# Patient Record
Sex: Female | Born: 1975
Health system: Southern US, Community
[De-identification: ages and names within clinical notes are randomized; demographics above are authoritative.]

## PROBLEM LIST (undated history)

## (undated) DIAGNOSIS — I10 Essential (primary) hypertension: Secondary | ICD-10-CM

## (undated) DIAGNOSIS — S060X9A Concussion with loss of consciousness of unspecified duration, initial encounter: Secondary | ICD-10-CM

## (undated) DIAGNOSIS — R569 Unspecified convulsions: Secondary | ICD-10-CM

## (undated) DIAGNOSIS — M940 Chondrocostal junction syndrome [Tietze]: Secondary | ICD-10-CM

## (undated) DIAGNOSIS — S060XAA Concussion with loss of consciousness status unknown, initial encounter: Secondary | ICD-10-CM

## (undated) DIAGNOSIS — F319 Bipolar disorder, unspecified: Secondary | ICD-10-CM

## (undated) HISTORY — DX: Bipolar disorder, unspecified: F31.9

## (undated) HISTORY — DX: Essential (primary) hypertension: I10

## (undated) HISTORY — DX: Chondrocostal junction syndrome (tietze): M94.0

---

## 2013-05-08 HISTORY — PX: KNEE SURGERY: SHX244

## 2015-11-05 ENCOUNTER — Encounter (HOSPITAL_COMMUNITY): Payer: Self-pay | Admitting: *Deleted

## 2015-11-05 ENCOUNTER — Emergency Department (HOSPITAL_COMMUNITY)
Admission: EM | Admit: 2015-11-05 | Discharge: 2015-11-05 | Disposition: A | Discharge status: Home / Self Care | Payer: Self-pay | Attending: Emergency Medicine | Admitting: Emergency Medicine

## 2015-11-05 DIAGNOSIS — Z91040 Latex allergy status: Secondary | ICD-10-CM | POA: Insufficient documentation

## 2015-11-05 DIAGNOSIS — F17210 Nicotine dependence, cigarettes, uncomplicated: Secondary | ICD-10-CM | POA: Insufficient documentation

## 2015-11-05 DIAGNOSIS — G40909 Epilepsy, unspecified, not intractable, without status epilepticus: Secondary | ICD-10-CM | POA: Insufficient documentation

## 2015-11-05 DIAGNOSIS — R569 Unspecified convulsions: Secondary | ICD-10-CM

## 2015-11-05 HISTORY — DX: Unspecified convulsions: R56.9

## 2015-11-05 LAB — CBG MONITORING, ED: GLUCOSE-CAPILLARY: 93 mg/dL (ref 65–99)

## 2015-11-05 LAB — CBC WITH DIFFERENTIAL/PLATELET
Basophils Absolute: 0 K/uL (ref 0.0–0.1)
Basophils Relative: 0 %
Eosinophils Absolute: 0.1 K/uL (ref 0.0–0.7)
Eosinophils Relative: 1 %
HCT: 30.3 % — ABNORMAL LOW (ref 36.0–46.0)
Hemoglobin: 10 g/dL — ABNORMAL LOW (ref 12.0–15.0)
Lymphocytes Relative: 26 %
Lymphs Abs: 2.1 K/uL (ref 0.7–4.0)
MCH: 26.5 pg (ref 26.0–34.0)
MCHC: 33 g/dL (ref 30.0–36.0)
MCV: 80.4 fL (ref 78.0–100.0)
Monocytes Absolute: 0.5 K/uL (ref 0.1–1.0)
Monocytes Relative: 6 %
Neutro Abs: 5.3 K/uL (ref 1.7–7.7)
Neutrophils Relative %: 67 %
Platelets: 153 K/uL (ref 150–400)
RBC: 3.77 MIL/uL — ABNORMAL LOW (ref 3.87–5.11)
RDW: 14.6 % (ref 11.5–15.5)
WBC: 7.9 K/uL (ref 4.0–10.5)

## 2015-11-05 LAB — BASIC METABOLIC PANEL WITH GFR
Anion gap: 7 (ref 5–15)
BUN: 6 mg/dL (ref 6–20)
CO2: 18 mmol/L — ABNORMAL LOW (ref 22–32)
Calcium: 8 mg/dL — ABNORMAL LOW (ref 8.9–10.3)
Chloride: 113 mmol/L — ABNORMAL HIGH (ref 101–111)
Creatinine, Ser: 0.66 mg/dL (ref 0.44–1.00)
GFR calc Af Amer: >60 mL/min
GFR calc non Af Amer: >60 mL/min
Glucose, Bld: 82 mg/dL (ref 65–99)
Potassium: 3.9 mmol/L (ref 3.5–5.1)
Sodium: 138 mmol/L (ref 135–145)

## 2015-11-05 MED ORDER — LEVETIRACETAM 1000 MG PO TABS: 1000.0000 mg | ORAL_TABLET | Freq: Two times a day (BID) | ORAL | 0 refills | Status: DC

## 2015-11-05 MED ORDER — SODIUM CHLORIDE 0.9 % IV SOLN
1000.0000 mg | Freq: Once | INTRAVENOUS | Status: AC
  Administered 2015-11-05: 1000 mg via INTRAVENOUS
  Filled 2015-11-05: qty 10

## 2015-11-05 NOTE — ED Notes (Signed)
Patient Alert and oriented X4. Stable and ambulatory. Patient verbalized understanding of the discharge instructions.  Patient belongings were taken by the patient.  

## 2015-11-05 NOTE — ED Notes (Signed)
CBG 93 

## 2015-11-05 NOTE — Discharge Instructions (Signed)
Take keppra as prescribed. [facility] have [facility]. You will need follow up with neurology for seizures as well as cardiology for a new Left bundle branch block seen on EKG. Return to the ED if you experience severe worsening of your symptoms, difficulty breathing, recurrent seizure, blurred vision.

## 2015-11-05 NOTE — ED Triage Notes (Signed)
Pt arrives from home via [facility]. Pt had 2 witnessed tonic clonic seizures today rt not being on her Keppra x1 week. Pt has a LBBB with no known cardiac hx. Pt has c/o chest wall pain that she states is baseline.

## 2015-11-05 NOTE — ED Provider Notes (Signed)
[facility]-EMERGENCY DEPT Provider Note   CSN: [id number] Arrival date & time: 11/05/15  1554     History   Chief Complaint Chief Complaint  Patient presents with  . Seizures    HPI Denise Bennett is a 40 y.o. female with a past medical history of seizure disorder who presents to the ED today to be evaluated after a witnessed seizure. Per patient's son who witnessed the seizure, patient was sitting up in bed watching TV when she began having a generalized tonic-clonic seizure. Patient son states that this lasted for approximately 2 minutes. No head injury or urinary incontinence. Patient remained postictal for approximately 10-15 minutes after the seizure. Patient states she's been having seizures since 2009 and was followed for this by neurology in [place]. She states she ran out of her Keppra about one week ago and has not had enough money to get a new prescription. Patient recently moved to the area and has not seen a neurologist due to lack of money. She denies any blurry vision, weakness, vomiting. She has a mild headache but states this is typically how she feels after having a seizure. 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 Patient also complaining of anterior right-sided chest pain that has been present for 3 years. Pain is worsened with palpation and is intermittent in nature. Pain does not radiate. She denies any diaphoresis, shortness of breath, weakness.  HPI  Past Medical History:  Diagnosis Date  . Seizures (HCC)     There are no active problems to display for this patient.   Past Surgical History:  Procedure Laterality Date  . CESAREAN SECTION    . KNEE SURGERY      OB History    No data available       Home Medications    Prior to Admission medications   Not on File    Family History No family history on file.  Social  History Social History  Substance Use Topics  . Smoking status: Unknown If Ever Smoked  . Smokeless tobacco: Not on file  . Alcohol use No     Allergies   Cinnamon; Penicillins; Latex; Other; and Tape   Review of Systems Review of Systems  All other systems reviewed and are negative.    Physical Exam Updated Vital Signs BP 122/69 (BP Location: Left Arm)   Pulse 60   Temp 98.2 F (36.8 C) (Oral)   Resp 12   SpO2 97%   Physical Exam  Constitutional: She is oriented to person, place, and time. She appears well-developed and well-nourished. No distress.  HENT:  Head: Normocephalic and atraumatic.  Mouth/Throat: No oropharyngeal exudate.  Eyes: Conjunctivae and EOM are normal. Pupils are equal, round, and reactive to light. Right eye exhibits no discharge. Left eye exhibits no discharge. No scleral icterus.  Cardiovascular: Normal rate, regular rhythm, normal heart sounds and intact distal pulses.  Exam reveals no gallop and no friction rub.   No murmur heard. Pulmonary/Chest: Effort normal and breath sounds normal. No respiratory distress. She has no wheezes. She has no rales. She exhibits tenderness ( anterior chest wall TTP).  Abdominal: Soft. She exhibits no distension. There is no tenderness. There is no guarding.  Musculoskeletal: Normal range of motion. She exhibits no edema.  Neurological: She is alert and oriented to person, place, and time. No cranial nerve deficit.  Strength  5/5 throughout. No sensory deficits. No gait abnormality. No dysmetria. No slurred speech. No facial droop. Negative pronator drift.    Skin: Skin is warm and dry. No rash noted. She is not diaphoretic. No erythema. No pallor.  Psychiatric: She has a normal mood and affect. Her behavior is normal.  Nursing note and vitals reviewed.    ED Treatments / Results  Labs (all labs ordered are listed, but only abnormal results are displayed) Labs Reviewed  CBC WITH DIFFERENTIAL/PLATELET -  Abnormal; Notable for the following:       Result Value   RBC 3.77 (*)    Hemoglobin 10.0 (*)    HCT 30.3 (*)    All other components within normal limits  BASIC METABOLIC PANEL - Abnormal; Notable for the following:    Chloride 113 (*)    CO2 18 (*)    Calcium 8.0 (*)    All other components within normal limits  CBG MONITORING, ED    EKG  EKG Interpretation  Date/Time:  Tuesday November 05 2015 18:02:32 EDT Ventricular Rate:  76 PR Interval:    QRS Duration: 153 QT Interval:  465 QTC Calculation: 523 R Axis:   6 Text Interpretation:  Sinus rhythm Left bundle branch block No previous ECGs available Confirmed by ZACKOWSKI  MD, SCOTT ([id number]) on 11/05/2015 6:13:56 PM       Radiology No results found.  Procedures Procedures (including critical care time)  Medications Ordered in ED Medications  levETIRAcetam (KEPPRA) 1,000 mg in sodium chloride 0.9 % 100 mL IVPB (not administered)     Initial Impression / Assessment and Plan / ED Course  I have reviewed the triage vital signs and the nursing notes.  Pertinent labs & imaging results that were available during my care of the patient were reviewed by me and considered in my medical decision making (see chart for details).  Clinical Course    Patient with no evidence of focal neuro deficits on physical exam and is at mental baseline.  Labs and imaging have been reviewed.  Pt was given Keppra load in the ED. Patient is advised to followup with neurologist in regards to today's event. Case management consulted who spoke with pt and arranged for [facility]. Pt will also be able to purchase Keppra prescription for $3.  Spoke with patient and family in detail about driving restrictions until cleared by a neurologist.  Patient verbalizes understanding.  Answered all questions.  Patient is hemodynamically stable and in no acute distress prior to discharge.    Final  Clinical Impressions(s) / ED Diagnoses   Final diagnoses:  Seizure [facility](HCC)    New Prescriptions New Prescriptions   No medications on file     Dub MikesSamantha Tripp Dowless, PA-C 11/06/15 0001    Vanetta MuldersScott Zackowski, MD 11/08/15 [phone]

## 2015-11-05 NOTE — Progress Notes (Signed)
ED CM received consult to assist patient with f/u primary care. CM reviewed record noted no PCP or health coverage listed in record. Patient presented to [facility] ED with c/o seizures and has run of [place].  Met with patient at bedside, confirmed information. Patient reports recently relocating to the area from [place]. She states, she cannot afford her medications, Discussed MATCH program and guidelines patient.  Patient is eligible CM enrolled patient in program, letter printed and given to patient along with list of participating pharmacies and instruction. Patient verbalized understanding teach back done.   ED CM also discussed the importance and benefits of primary care, and reviewed the different types of setting to access health care, and the conditions appropriate for each setting. Offered to assist with obtaining an  appointment patient agreeable Appt scheduled for Wed 10/11 at 11:30 am at the [facility].  Updated S. Dowless PA-C. No further ED CM needs identified.

## 2015-11-06 ENCOUNTER — Encounter: Payer: Self-pay | Admitting: Family Medicine

## 2015-11-06 ENCOUNTER — Ambulatory Visit: Payer: Self-pay | Attending: Family Medicine | Admitting: Family Medicine

## 2015-11-06 DIAGNOSIS — I447 Left bundle-branch block, unspecified: Secondary | ICD-10-CM | POA: Insufficient documentation

## 2015-11-06 DIAGNOSIS — I10 Essential (primary) hypertension: Secondary | ICD-10-CM | POA: Insufficient documentation

## 2015-11-06 DIAGNOSIS — Z91040 Latex allergy status: Secondary | ICD-10-CM | POA: Insufficient documentation

## 2015-11-06 DIAGNOSIS — Z880 Allergy status to penicillin: Secondary | ICD-10-CM | POA: Insufficient documentation

## 2015-11-06 DIAGNOSIS — E162 Hypoglycemia, unspecified: Secondary | ICD-10-CM | POA: Insufficient documentation

## 2015-11-06 DIAGNOSIS — R569 Unspecified convulsions: Secondary | ICD-10-CM | POA: Insufficient documentation

## 2015-11-06 DIAGNOSIS — Z9109 Other allergy status, other than to drugs and biological substances: Secondary | ICD-10-CM | POA: Insufficient documentation

## 2015-11-06 DIAGNOSIS — Z91018 Allergy to other foods: Secondary | ICD-10-CM | POA: Insufficient documentation

## 2015-11-06 MED ORDER — ALBUTEROL SULFATE HFA 108 (90 BASE) MCG/ACT IN AERS: 1.0000 | INHALATION_SPRAY | Freq: Four times a day (QID) | RESPIRATORY_TRACT | 1 refills | Status: DC | PRN

## 2015-11-06 NOTE — Progress Notes (Signed)
Subjective:  Patient ID: Denise Bennett, female    DOB: [birth year]  Age: 40 y.o. MRN: [id number]  CC: Seizures (was in ED yesterday after 2 seizures); Hypoglycemia; Hypertension; Manic Behavior; and chest wall inflammation   HPI Denise Bennett is a 40 year old female with a history of seizures who relocated from [place] recently and was [facility] yesterday with a partial seizure (witnessed by her son) after running out of her Keppra. While in [place] she was previously followed by neurologist but has none here.  She was given a loading dose of Keppra and discharged on Keppra which she is currently taking and is requesting a referral to neurology. EKG done at the ED revealed left bundle branch block; no previous EKG to compare with. Patient has no prior cardiac history and denies shortness of breath or palpitations at this time. She has some right-sided chest pain which occurred when she had the seizure yesterday.  Past Medical History:  Diagnosis Date  . Seizures (HCC)     Past Surgical History:  Procedure Laterality Date  . CESAREAN SECTION    . KNEE SURGERY      Allergies  Allergen Reactions  . Cinnamon Anaphylaxis  . Penicillins Anaphylaxis and Other (See Comments)    Has patient had a PCN reaction causing immediate rash, facial/tongue/throat swelling, SOB or lightheadedness with hypotension: Yes Has patient had a PCN reaction causing severe rash involving mucus membranes or skin necrosis: Unknown Has patient had a PCN reaction that required hospitalization: Yes Has patient had a PCN reaction occurring within the last 10 years: No If all of the above answers are "NO", then may proceed with Cephalosporin use. "Ended up in an O2 tent"   . Latex Rash    RASH TURNS INTO WELTS  . Other Itching and Rash    GREEN (BELL) PEPPERS   . Tape Rash    RASH TURNS INTO WELTS      Outpatient Medications Prior to Visit  Medication Sig Dispense Refill  . acetaminophen  (TYLENOL) 500 MG tablet Take 500-1,000 mg by mouth every 6 (six) hours as needed for headache.    . diphenhydrAMINE (BENADRYL) 25 mg capsule Take 25 mg by mouth every 6 (six) hours as needed for itching or allergies.    Marland Kitchen. levETIRAcetam (KEPPRA) 1000 MG tablet Take 1 tablet (1,000 mg total) by mouth 2 (two) times daily. 60 tablet 0  . albuterol (PROAIR HFA) 108 (90 Base) MCG/ACT inhaler Inhale 1-2 puffs into the lungs every 6 (six) hours as needed for wheezing or shortness of breath.    . levETIRAcetam (KEPPRA) 500 MG tablet Take 1,000 mg by mouth 2 (two) times daily.     No facility-administered medications prior to visit.     ROS Review of Systems  Constitutional: Negative for activity change, appetite change and fatigue.  HENT: Negative for congestion, sinus pressure and sore throat.   Eyes: Negative for visual disturbance.  Respiratory: Negative for cough, chest tightness, shortness of breath and wheezing.   Cardiovascular: Negative for chest pain and palpitations.  Gastrointestinal: Negative for abdominal distention, abdominal pain and constipation.  Endocrine: Negative for polydipsia.  Genitourinary: Negative for dysuria and frequency.  Musculoskeletal: Negative for arthralgias and back pain.  Skin: Negative for rash.  Neurological: Negative for tremors, light-headedness and numbness.  Hematological: Does not bruise/bleed easily.  Psychiatric/Behavioral: Negative for agitation and behavioral problems.    Objective:  BP (P) 119/75 (BP Location: Right Arm, Patient Position: Sitting, Cuff Size:  Large)   Pulse (P) 67   Temp (P) 98.1 F (36.7 C) (Oral)   Ht (P) 5' 4.5" (1.638 m)   Wt (P) 207 lb 6.4 oz (94.1 kg)   SpO2 (P) 99%   BMI (P) 35.05 kg/m   BP/Weight 11/05/2015  Systolic BP 126  Diastolic BP 79      Physical Exam  Constitutional: She is oriented to person, place, and time. She appears well-developed and well-nourished.  Cardiovascular: Normal rate, normal heart  sounds and intact distal pulses.   No murmur heard. Pulmonary/Chest: Effort normal and breath sounds normal. She has no wheezes. She has no rales. She exhibits tenderness (right-sided tenderness on deep palpation).  Abdominal: Soft. Bowel sounds are normal. She exhibits no distension and no mass. There is no tenderness.  Musculoskeletal: Normal range of motion.  Neurological: She is alert and oriented to person, place, and time.     Assessment & Plan:   1. Seizures (HCC) Continue Keppra Avoid driving Will place referral to neurology once she has been approved for [facility] this; I have advised her to obtain paperwork for this application. Follow-up for coordination of care at next visit  2. Left bundle branch block (LBBB) on electrocardiogram Asymptomatic with no prior cardiac history - Ambulatory referral to Cardiology   Meds ordered this encounter  Medications  . albuterol (PROAIR HFA) 108 (90 Base) MCG/ACT inhaler    Sig: Inhale 1-2 puffs into the lungs every 6 (six) hours as needed for wheezing or shortness of breath.    Dispense:  1 Inhaler    Refill:  1    Follow-up: Return in about 1 month (around 12/07/2015) for follow up on seizures.   Jaclyn Shaggy MD

## 2015-11-06 NOTE — Progress Notes (Signed)
Here for medication refills and referrals

## 2015-11-29 ENCOUNTER — Ambulatory Visit: Payer: Self-pay

## 2015-12-04 ENCOUNTER — Ambulatory Visit: Payer: Self-pay | Attending: Family Medicine | Admitting: Family Medicine

## 2015-12-04 ENCOUNTER — Encounter: Payer: Self-pay | Admitting: Family Medicine

## 2015-12-04 VITALS — BP 146/85 | HR 63 | Temp 97.8°F | Resp 16 | Ht 64.0 in | Wt 200.0 lb

## 2015-12-04 DIAGNOSIS — R569 Unspecified convulsions: Secondary | ICD-10-CM

## 2015-12-04 DIAGNOSIS — Z880 Allergy status to penicillin: Secondary | ICD-10-CM | POA: Insufficient documentation

## 2015-12-04 DIAGNOSIS — Z9109 Other allergy status, other than to drugs and biological substances: Secondary | ICD-10-CM | POA: Insufficient documentation

## 2015-12-04 DIAGNOSIS — Z91040 Latex allergy status: Secondary | ICD-10-CM | POA: Insufficient documentation

## 2015-12-04 DIAGNOSIS — M940 Chondrocostal junction syndrome [Tietze]: Secondary | ICD-10-CM

## 2015-12-04 DIAGNOSIS — Z91018 Allergy to other foods: Secondary | ICD-10-CM | POA: Insufficient documentation

## 2015-12-04 DIAGNOSIS — I447 Left bundle-branch block, unspecified: Secondary | ICD-10-CM

## 2015-12-04 MED ORDER — LEVETIRACETAM 1000 MG PO TABS: 1000.0000 mg | ORAL_TABLET | Freq: Two times a day (BID) | ORAL | 1 refills | Status: DC

## 2015-12-04 MED ORDER — IBUPROFEN 600 MG PO TABS: 600.0000 mg | ORAL_TABLET | Freq: Two times a day (BID) | ORAL | 1 refills | Status: DC | PRN

## 2015-12-04 MED FILL — levETIRAcetam 1000 MG TABS: 1000 | 30 days supply | Qty: 60 | Fill #0

## 2015-12-04 MED FILL — IBUPROFEN 600 MG TABLET: 600 | 30 days supply | Qty: 60 | Fill #0

## 2015-12-04 NOTE — Progress Notes (Signed)
Subjective:  Patient ID: Denise Bennett, female    DOB: [birth year]  Age: 40 y.o. MRN: [id number]  CC: Follow-up (seizures); Chest Pain (mild- comes and goes x's 3 days); and Nausea   HPI Denise Bennett is a 40 year old female with a history of seizures who presents to the clinic for a follow-up visit. She remains on Keppra and has not had any seizures in the last 1 month. Neurology referral is pending approval of [facility] financial application.  She was also referred to cardiology due to presence of left bundle branch block on EKG however attempts of cardiology office to reach patient proved futile.  She has no complaints at this time.  Past Medical History:  Diagnosis Date  . Seizures (HCC)     Past Surgical History:  Procedure Laterality Date  . CESAREAN SECTION    . KNEE SURGERY      Allergies  Allergen Reactions  . Cinnamon Anaphylaxis  . Penicillins Anaphylaxis and Other (See Comments)    Has patient had a PCN reaction causing immediate rash, facial/tongue/throat swelling, SOB or lightheadedness with hypotension: Yes Has patient had a PCN reaction causing severe rash involving mucus membranes or skin necrosis: Unknown Has patient had a PCN reaction that required hospitalization: Yes Has patient had a PCN reaction occurring within the last 10 years: No If all of the above answers are "NO", then may proceed with Cephalosporin use. "Ended up in an O2 tent"   . Latex Rash    RASH TURNS INTO WELTS  . Other Itching and Rash    GREEN (BELL) PEPPERS   . Tape Rash    RASH TURNS INTO WELTS     Outpatient Medications Prior to Visit  Medication Sig Dispense Refill  . acetaminophen (TYLENOL) 500 MG tablet Take 500-1,000 mg by mouth every 6 (six) hours as needed for headache.    . albuterol (PROAIR HFA) 108 (90 Base) MCG/ACT inhaler Inhale 1-2 puffs into the lungs every 6 (six) hours as needed for wheezing or shortness of breath. 1 Inhaler 1  . diphenhydrAMINE (BENADRYL) 25  mg capsule Take 25 mg by mouth every 6 (six) hours as needed for itching or allergies.    Marland Kitchen. levETIRAcetam (KEPPRA) 1000 MG tablet Take 1 tablet (1,000 mg total) by mouth 2 (two) times daily. 60 tablet 0  . levETIRAcetam (KEPPRA) 500 MG tablet Take 1,000 mg by mouth 2 (two) times daily.     No facility-administered medications prior to visit.     ROS Review of Systems  Constitutional: Negative for activity change and appetite change.  HENT: Negative for sinus pressure and sore throat.   Respiratory: Negative for chest tightness, shortness of breath and wheezing.   Cardiovascular: Negative for chest pain and palpitations.  Gastrointestinal: Negative for abdominal distention, abdominal pain and constipation.  Genitourinary: Negative.   Musculoskeletal: Negative.   Psychiatric/Behavioral: Negative for behavioral problems and dysphoric mood.    Objective:  BP (!) 146/85 (BP Location: Right Arm, Patient Position: Sitting)   Pulse 63   Temp 97.8 F (36.6 C) (Oral)   Resp 16   Ht 5\' 4"  (1.626 m)   Wt 200 lb (90.7 kg)   SpO2 99%   BMI 34.33 kg/m   BP/Weight 12/04/2015 11/05/2015  Systolic BP 146 126  Diastolic BP 85 79  Wt. (Lbs) 200 -  BMI 34.33 -      Physical Exam  Constitutional: She is oriented to person, place, and time. She appears well-developed and well-nourished.  Cardiovascular: Normal rate, normal heart sounds and intact distal pulses.   No murmur heard. Pulmonary/Chest: Effort normal and breath sounds normal. She has no wheezes. She has no rales. She exhibits tenderness (right chest wall tenderness to deep palpation).  Abdominal: Soft. Bowel sounds are normal. She exhibits no distension and no mass. There is no tenderness.  Musculoskeletal: Normal range of motion.  Neurological: She is alert and oriented to person, place, and time.     Assessment & Plan:   1. Costochondritis - ibuprofen (ADVIL,MOTRIN) 600 MG tablet; Take 1 tablet (600 mg total) by mouth 2  (two) times daily as needed.  Dispense: 60 tablet; Refill: 1  2. Left bundle branch block (LBBB) on electrocardiogram Referred to cardiology who have been unable to reach the patient She has been provided with the number to cardiology on [address] to call and schedule an appointment.  3. Seizures (HCC) No recent seizures Currently working on [facility] application for referral to neurology. - levETIRAcetam (KEPPRA) 1000 MG tablet; Take 1 tablet (1,000 mg total) by mouth 2 (two) times daily.  Dispense: 60 tablet; Refill: 1   Meds ordered this encounter  Medications  . levETIRAcetam (KEPPRA) 1000 MG tablet    Sig: Take 1 tablet (1,000 mg total) by mouth 2 (two) times daily.    Dispense:  60 tablet    Refill:  1  . ibuprofen (ADVIL,MOTRIN) 600 MG tablet    Sig: Take 1 tablet (600 mg total) by mouth 2 (two) times daily as needed.    Dispense:  60 tablet    Refill:  1    Follow-up: Return in about 2 months (around 02/03/2016) for follow up of seizures.   Jaclyn ShaggyEnobong Amao MD

## 2015-12-04 NOTE — Patient Instructions (Signed)

## 2015-12-04 NOTE — Progress Notes (Signed)
Med refills

## 2015-12-09 ENCOUNTER — Ambulatory Visit (INDEPENDENT_AMBULATORY_CARE_PROVIDER_SITE_OTHER): Payer: Self-pay | Admitting: Cardiology

## 2015-12-09 ENCOUNTER — Encounter: Payer: Self-pay | Admitting: Cardiology

## 2015-12-09 VITALS — BP 136/82 | HR 71 | Ht 64.5 in | Wt 200.0 lb

## 2015-12-09 DIAGNOSIS — I447 Left bundle-branch block, unspecified: Secondary | ICD-10-CM

## 2015-12-09 DIAGNOSIS — R0789 Other chest pain: Secondary | ICD-10-CM | POA: Insufficient documentation

## 2015-12-09 DIAGNOSIS — R002 Palpitations: Secondary | ICD-10-CM

## 2015-12-09 DIAGNOSIS — M940 Chondrocostal junction syndrome [Tietze]: Secondary | ICD-10-CM

## 2015-12-09 DIAGNOSIS — R079 Chest pain, unspecified: Secondary | ICD-10-CM

## 2015-12-09 NOTE — Progress Notes (Signed)
PCP: Jaclyn ShaggyEnobong, Amao, MD  Clinic Note: Chief Complaint  Patient presents with  . New Patient (Initial Visit)    Left bundle branch block on EKG  . Chest Pain    HPI: Denise Bennett is a 40 y.o. female with a PMH notable for seizure disorder (deep brain seizures) who presents today for evaluation of intermittent chest pain and nausea. Also reported left bundle branch block.Marland Kitchen.   Errica Guarnieri was seen on 12/04/2015 by her PCP and noted some intermittent chest pain. Currently prior to that she was referred to cardiology for left bundle branch block. She was felt to have likely costochondritis and given Advil  Recent Hospitalizations: ER visit for a witnessed seizure on 11/05/2015 --> EKG done post procedure showed left bundle branch block. Per report, the seizure lasted about 2 minutes. She then had a postictal episode of roughly 10-15 minutes. She apparently complained of right-sided chest pain at that time. This is been present for years and was told that this was costochondritis.   Studies Reviewed:   EKG from 11/05/2015 did show a left bundle branch block  Interval History: Breezie presents today for follow-up evaluation from her ER visit. One concern for ER visit was a left bundle branch block, the other was that she's been having chest pain off and on. Chest discomfort that she is describing is different and separate from her chronic reproducible mostly right sided chest pain from costochondritis. This is a long-standing issue for her. The current chest discomfort episode is actually more of a tightness and pressure that she feels if she is under a lot of stress (which has been quite frequent with her recent move). She also notes that if she walks too fast. With a slow easy walk, she does okay, but other times will continue to have this chest discomfort which lasts several minutes. She may have 3-4 of these episodes in the course of the week depending what she does. She also has had several  episodes where she feels her heart rate increases as if it is "beating out of her chest ". It is steady, but fast and hard. Not irregular. It may last 15-20 minutes until she is able to calm herself back down again. This also occurs about 3-4 times a week.  Today she actually walked almost 2 hours to the bus stop that she then took the bus here to the clinic visit. That two-hour walk was made as long as it was because she had to stop several times for dyspnea and at least once for chest discomfort.  She denies any resting chest pain or dyspnea, distantly exertional chest pain dyspnea as well as costochondritis pain. With her rapid heartbeats, she denies any syncope, but has had some dizziness and near syncope. No amaurosis fugax or TIA symptoms.  No claudication.:  No flowsheet data found.  ROS: A comprehensive was performed. Review of Systems  Constitutional: Negative for malaise/fatigue.  HENT: Negative for congestion and nosebleeds.   Eyes: Positive for blurred vision (Postictal).  Respiratory: Negative for cough, shortness of breath and wheezing.   Cardiovascular: Positive for chest pain and palpitations.       See history of present illness; costochondritis usually treated with ibuprofen to some success  Gastrointestinal: Positive for heartburn. Negative for abdominal pain, blood in stool, constipation, melena, nausea and vomiting.  Genitourinary: Negative for hematuria.  Musculoskeletal: Positive for joint pain. Negative for back pain and falls. Myalgias: Postictal.       Chronic chest  wall pain  Neurological: Positive for headaches (Often after her seizures). Negative for weakness.       She does have some residual weakness of the right upper and lower extremity as well as decreased sensation of her right face as a result of her "deep brain seizures "  Endo/Heme/Allergies: Negative for environmental allergies.  Psychiatric/Behavioral: Negative for depression and memory loss. The  patient is not nervous/anxious.     Complete history including medical and surgical history as well as family and social history reviewed and updated (roughly 15 min) Past Medical History:  Diagnosis Date  . Bipolar 1 disorder (HCC)    Manic - depressive.  . 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 Hypertension   . Seizures (HCC)    Reportedly "deep brain seizures " - has some right-sided weakness (decreased sensation of facial nerve) over the seizures with some intermittent Bell's palsy symptoms.    Past Surgical History:  Procedure Laterality Date  . CESAREAN SECTION  10/31/1992, 03/02/1996.  Marland Kitchen KNEE SURGERY Left 05/08/2013   Prior to Admission medications   Medication Sig Start Date End Date Taking? Authorizing Provider  acetaminophen (TYLENOL) 500 MG tablet Take 500-1,000 mg by mouth every 6 (six) hours as needed for headache.   Yes Historical Provider, MD  albuterol (PROAIR HFA) 108 (90 Base) MCG/ACT inhaler Inhale 1-2 puffs into the lungs every 6 (six) hours as needed for wheezing or shortness of breath. 11/06/15  Yes Jaclyn Shaggy, MD  diphenhydrAMINE (BENADRYL) 25 mg capsule Take 25 mg by mouth every 6 (six) hours as needed for itching or allergies.   Yes Historical Provider, MD  ibuprofen (ADVIL,MOTRIN) 600 MG tablet Take 1 tablet (600 mg total) by mouth 2 (two) times daily as needed. 12/04/15  Yes Jaclyn Shaggy, MD  levETIRAcetam (KEPPRA) 1000 MG tablet Take 1 tablet (1,000 mg total) by mouth 2 (two) times daily. 12/04/15  Yes Jaclyn Shaggy, MD   Allergies  Allergen Reactions  . Cinnamon Anaphylaxis  . Penicillins Anaphylaxis and Other (See Comments)    Has patient had a PCN reaction causing immediate rash, facial/tongue/throat swelling, SOB or lightheadedness with hypotension: Yes Has patient had a PCN reaction causing severe rash involving mucus membranes or skin necrosis: Unknown Has patient had a PCN reaction that required hospitalization: Yes Has  patient had a PCN reaction occurring within the last 10 years: No If all of the above answers are "NO", then may proceed with Cephalosporin use. "Ended up in an O2 tent"   . Latex Rash    RASH TURNS INTO WELTS  . Other Itching and Rash    GREEN (BELL) PEPPERS   . Tape Rash    RASH TURNS INTO WELTS     Social History   Social History  . Marital status: Married    Spouse name: N/A  . Number of children: 2  . Years of education: GED   Occupational History  . Disabled     Secondary to seizures   Social History Main Topics  . Smoking status: Current Every Day Smoker    Packs/day: 0.50    Years: 15.00    Types: Cigarettes  . Smokeless tobacco: Never Used  . Alcohol use No  . Drug use: No  . Sexual activity: Yes    Partners: Male   Other Topics Concern  . None   Social History Narrative   She is married mother of 2 with one stepson. Her first child was from  a different father. She lives with her husband and son. They have recently moved from [place] to [facility].   She has been smoking for about 15 years.   She walks quite a bit several days a week at least 1-1/2-2 hours at a slow pace.   Family History  Problem Relation Age of Onset  . Heart attack Mother   . Heart attack Father   . Healthy Sister   . Psychiatric Illness Brother     Multiple personality disorder  . Healthy Brother   . Healthy Brother   . Healthy Sister   . Healthy Sister   . Healthy Sister   . Diabetes Mellitus I Son   . Bone cancer Paternal Grandmother   . Lung cancer Paternal Grandfather      Wt Readings from Last 3 Encounters:  12/09/15 90.7 kg (200 lb)  12/04/15 90.7 kg (200 lb)  11/06/15 (P) 94.1 kg (207 lb 6.4 oz)    PHYSICAL EXAM BP 136/82 (BP Location: Left Arm, Cuff Size: Normal)   Pulse 71   Ht 5' 4.5" (1.638 m)   Wt 90.7 kg (200 lb)   LMP 12/02/2015   BMI 33.80 kg/m  General appearance: alert, cooperative, appears stated age, no distress and Mildly  obese. She is nourished, but somewhat disheveled - has smell of cigarettes and pet dander. She seems somewhat anxious, but otherwise normal mood and affect. HEENT: [place]/AT, EOMI, MMM, anicteric sclera  Neck: no adenopathy, no carotid bruit and no JVD Lungs: clear to auscultation bilaterally, normal percussion bilaterally and non-labored Heart: regular rate and rhythm, S1& S2 normal, no murmur, click, rub or gallop; non-displaced PMI ;  Chest wall TTP along R>L sternal border. (different than CP in question) Abdomen: soft, non-tender; bowel sounds normal; no masses,  no organomegaly; no HJR Extremities: extremities normal, atraumatic, no cyanosis, or edema  Pulses: 2+ and symmetric;  Skin: mobility and turgor normal, no evidence of bleeding or bruising and no lesions noted or Neurologic: Mental status: Alert, oriented, thought content appropriate Cranial nerves: normal (II-XII grossly intact) with the exception of reduced R facial N sensation    Adult ECG Report  Rate: 71 ;  Rhythm: normal sinus rhythm and Normal axis, intervals and durations.;   Narrative Interpretation: When compared to the last 2 EKGs from emergency room visit, left bundle branch block is no longer present (this would suggest possible rate related left bundle branch block)   Other studies Reviewed: Additional studies/ records that were reviewed today include:  Recent Labs:     Chemistry      Component Value Date/Time   NA 138 11/05/2015 1835   K 3.9 11/05/2015 1835   CL 113 (H) 11/05/2015 1835   CO2 18 (L) 11/05/2015 1835   BUN 6 11/05/2015 1835   CREATININE 0.66 11/05/2015 1835      Component Value Date/Time   CALCIUM 8.0 (L) 11/05/2015 1835       ASSESSMENT / PLAN: Problem List Items Addressed This Visit    Left bundle branch block (LBBB) on electrocardiogram - Primary    Interestingly, the current EKG did not show LBBB. The rate is slightly slower today. Cannot exclude rate related bundle branch block. We  will assess for ischemia and EF with Myoview stress test.      Relevant Orders   EKG 12-Lead (Completed)   Myocardial Perfusion Imaging   Cardiac event monitor   Costochondritis    Relatively well-controlled with NSAIDs. Lid different symptoms  than the more anginal type symptom.      Chest pain with moderate risk for cardiac etiology    She clearly has 2 different types of chest discomfort. One is clearly reproducible right-sided costal neuritis pain that she is well aware of. But she now has a different symptom that seems to be not reproducible, and mostly exertional in nature associated with some dyspnea. It is not with a routine mild exertion, but it is usually when she walks too far or is under lots of stress.  She indicates to me that she is not sure she would be able to walk at fast beats on the treadmill because of her mild residual weakness on the right leg. She has a significant family history for both her parents having died relatively early age with heart attacks. She also has had evidence of intermittent left bundle branch block at a relatively young age which would also be concerning for possible underlying coronary disease.  PLAN: Myoview stress test (this will allow us to potentially convert to Lexiscan from treadmill in order to complete study) -- this will also give us an estimation of her overall cardiac function given her exertional dyspnea.      Relevant Orders   EKG 12-Lead (Completed)   Myocardial Perfusion Imaging   Cardiac event monitor   Palpitations   Relevant Orders   EKG 12-Lead (Completed)   Myocardial Perfusion Imaging   Cardiac event monitor      Current medicines are reviewed at length with the patient today. (+/- concerns) n/a The following changes have been made: none  Patient Instructions  SCHEDULE AT [address] Your physician has requested that you have en exercise stress myoview. For further information please visit  [email or web address]. Please follow instruction sheet, as given.  SCHEDULE AT [address]  [address] Your physician has recommended that you wear an event monitor WEAR FOR 30 DAYS. Event monitors are medical devices that record the heart's electrical activity. Doctors most often us these monitors to diagnose arrhythmias. Arrhythmias are problems with the speed or rhythm of the heartbeat. The monitor is a small, portable device. You can wear one while you do your normal daily activities. This is usually used to diagnose what is causing palpitations/syncope (passing out).    Your physician recommends that you schedule a follow-up appointment in:6-8 WEEKS WITH DR HARDING.   Studies Ordered:   Orders Placed This Encounter  Procedures  . Myocardial Perfusion Imaging  . Cardiac event monitor  . EKG 12-Lead      Bryan Lemmaavid Harding, M.D., M.S. Interventional Cardiologist   Pager # [phone] Phone # [phone] [address] [place], [place] [id number]

## 2015-12-09 NOTE — Patient Instructions (Addendum)
SCHEDULE AT [address] Your physician has requested that you have en exercise stress myoview. For further information please visit [email or web address]. Please follow instruction sheet, as given.  SCHEDULE AT [address]  [address] Your physician has recommended that you wear an event monitor WEAR FOR 30 DAYS. Event monitors are medical devices that record the heart's electrical activity. Doctors most often us these monitors to diagnose arrhythmias. Arrhythmias are problems with the speed or rhythm of the heartbeat. The monitor is a small, portable device. You can wear one while you do your normal daily activities. This is usually used to diagnose what is causing palpitations/syncope (passing out).    Your physician recommends that you schedule a follow-up appointment in:6-8 WEEKS WITH DR HARDING.

## 2015-12-10 ENCOUNTER — Encounter: Payer: Self-pay | Admitting: Cardiology

## 2015-12-10 NOTE — Assessment & Plan Note (Signed)
She clearly has 2 different types of chest discomfort. One is clearly reproducible right-sided costal neuritis pain that she is well aware of. But she now has a different symptom that seems to be not reproducible, and mostly exertional in nature associated with some dyspnea. It is not with a routine mild exertion, but it is usually when she walks too far or is under lots of stress.  She indicates to me that she is not sure she would be able to walk at fast beats on the treadmill because of her mild residual weakness on the right leg. She has a significant family history for both her parents having died relatively early age with heart attacks. She also has had evidence of intermittent left bundle branch block at a relatively young age which would also be concerning for possible underlying coronary disease.  PLAN: Myoview stress test (this will allow us to potentially convert to Lexiscan from treadmill in order to complete study) -- this will also give us an estimation of her overall cardiac function given her exertional dyspnea.

## 2015-12-10 NOTE — Assessment & Plan Note (Signed)
Interestingly, the current EKG did not show LBBB. The rate is slightly slower today. Cannot exclude rate related bundle branch block. We will assess for ischemia and EF with Myoview stress test.

## 2015-12-10 NOTE — Assessment & Plan Note (Signed)
Relatively well-controlled with NSAIDs. Lid different symptoms than the more anginal type symptom.

## 2015-12-27 ENCOUNTER — Ambulatory Visit: Payer: Self-pay

## 2016-04-02 ENCOUNTER — Other Ambulatory Visit: Payer: Self-pay

## 2016-04-02 ENCOUNTER — Emergency Department (HOSPITAL_COMMUNITY): Payer: Self-pay

## 2016-04-02 ENCOUNTER — Emergency Department (HOSPITAL_COMMUNITY)
Admission: EM | Admit: 2016-04-02 | Discharge: 2016-04-02 | Disposition: A | Discharge status: Home / Self Care | Payer: Self-pay | Attending: Emergency Medicine | Admitting: Emergency Medicine

## 2016-04-02 DIAGNOSIS — R200 Anesthesia of skin: Secondary | ICD-10-CM | POA: Insufficient documentation

## 2016-04-02 DIAGNOSIS — I10 Essential (primary) hypertension: Secondary | ICD-10-CM | POA: Insufficient documentation

## 2016-04-02 DIAGNOSIS — F17210 Nicotine dependence, cigarettes, uncomplicated: Secondary | ICD-10-CM | POA: Insufficient documentation

## 2016-04-02 DIAGNOSIS — Z79899 Other long term (current) drug therapy: Secondary | ICD-10-CM | POA: Insufficient documentation

## 2016-04-02 DIAGNOSIS — R072 Precordial pain: Secondary | ICD-10-CM | POA: Insufficient documentation

## 2016-04-02 DIAGNOSIS — Z91040 Latex allergy status: Secondary | ICD-10-CM | POA: Insufficient documentation

## 2016-04-02 DIAGNOSIS — G8191 Hemiplegia, unspecified affecting right dominant side: Secondary | ICD-10-CM

## 2016-04-02 DIAGNOSIS — R531 Weakness: Secondary | ICD-10-CM | POA: Insufficient documentation

## 2016-04-02 DIAGNOSIS — G40909 Epilepsy, unspecified, not intractable, without status epilepticus: Secondary | ICD-10-CM

## 2016-04-02 LAB — I-STAT TROPONIN, ED
TROPONIN I, POC: 0 ng/mL (ref 0.00–0.08)
TROPONIN I, POC: 0.01 ng/mL (ref 0.00–0.08)

## 2016-04-02 LAB — COMPREHENSIVE METABOLIC PANEL
ALT: 20 U/L (ref 14–54)
AST: 21 U/L (ref 15–41)
Albumin: 3.2 g/dL — ABNORMAL LOW (ref 3.5–5.0)
Alkaline Phosphatase: 78 U/L (ref 38–126)
Anion gap: 8 (ref 5–15)
BUN: 7 mg/dL (ref 6–20)
CALCIUM: 8.8 mg/dL — AB (ref 8.9–10.3)
CO2: 22 mmol/L (ref 22–32)
CREATININE: 0.82 mg/dL (ref 0.44–1.00)
Chloride: 107 mmol/L (ref 101–111)
Glucose, Bld: 106 mg/dL — ABNORMAL HIGH (ref 65–99)
Potassium: 3.9 mmol/L (ref 3.5–5.1)
Sodium: 137 mmol/L (ref 135–145)
TOTAL PROTEIN: 6.1 g/dL — AB (ref 6.5–8.1)
Total Bilirubin: 0.4 mg/dL (ref 0.3–1.2)

## 2016-04-02 LAB — CBC
HEMATOCRIT: 39 % (ref 36.0–46.0)
HEMOGLOBIN: 13 g/dL (ref 12.0–15.0)
MCH: 26.8 pg (ref 26.0–34.0)
MCHC: 33.3 g/dL (ref 30.0–36.0)
MCV: 80.4 fL (ref 78.0–100.0)
Platelets: 216 10*3/uL (ref 150–400)
RBC: 4.85 MIL/uL (ref 3.87–5.11)
RDW: 15.1 % (ref 11.5–15.5)
WBC: 10.3 10*3/uL (ref 4.0–10.5)

## 2016-04-02 LAB — URINALYSIS, ROUTINE W REFLEX MICROSCOPIC
Bilirubin Urine: NEGATIVE
Glucose, UA: NEGATIVE mg/dL
HGB URINE DIPSTICK: NEGATIVE
Ketones, ur: NEGATIVE mg/dL
LEUKOCYTES UA: NEGATIVE
NITRITE: NEGATIVE
PROTEIN: NEGATIVE mg/dL
Specific Gravity, Urine: 1.011 (ref 1.005–1.030)
pH: 6 (ref 5.0–8.0)

## 2016-04-02 LAB — APTT: APTT: 33 s (ref 24–36)

## 2016-04-02 LAB — D-DIMER, QUANTITATIVE (NOT AT ARMC)

## 2016-04-02 LAB — I-STAT CHEM 8, ED
BUN: 7 mg/dL (ref 6–20)
CHLORIDE: 105 mmol/L (ref 101–111)
Calcium, Ion: 1.14 mmol/L — ABNORMAL LOW (ref 1.15–1.40)
Creatinine, Ser: 0.8 mg/dL (ref 0.44–1.00)
Glucose, Bld: 103 mg/dL — ABNORMAL HIGH (ref 65–99)
HEMATOCRIT: 39 % (ref 36.0–46.0)
Hemoglobin: 13.3 g/dL (ref 12.0–15.0)
POTASSIUM: 4 mmol/L (ref 3.5–5.1)
SODIUM: 139 mmol/L (ref 135–145)
TCO2: 24 mmol/L (ref 0–100)

## 2016-04-02 LAB — RAPID URINE DRUG SCREEN, HOSP PERFORMED
Amphetamines: NOT DETECTED
BARBITURATES: NOT DETECTED
Benzodiazepines: NOT DETECTED
Cocaine: NOT DETECTED
Opiates: NOT DETECTED
Tetrahydrocannabinol: NOT DETECTED

## 2016-04-02 LAB — DIFFERENTIAL
BASOS ABS: 0 10*3/uL (ref 0.0–0.1)
Basophils Relative: 0 %
EOS ABS: 0.1 10*3/uL (ref 0.0–0.7)
Eosinophils Relative: 1 %
LYMPHS ABS: 3 10*3/uL (ref 0.7–4.0)
Lymphocytes Relative: 29 %
MONO ABS: 0.8 10*3/uL (ref 0.1–1.0)
MONOS PCT: 8 %
NEUTROS ABS: 6.3 10*3/uL (ref 1.7–7.7)
Neutrophils Relative %: 62 %

## 2016-04-02 LAB — CBG MONITORING, ED: GLUCOSE-CAPILLARY: 105 mg/dL — AB (ref 65–99)

## 2016-04-02 LAB — PROTIME-INR
INR: 1.04
PROTHROMBIN TIME: 13.7 s (ref 11.4–15.2)

## 2016-04-02 LAB — ETHANOL

## 2016-04-02 NOTE — Discharge Instructions (Signed)
Please refill your Keppra prescription as prescribed by her previous neurologist. Please [place] to continue your management. Please follow-up with your PCP for further management of your chest pain today. Your workup was negative for strokes, infection, or cardiac cause of her pain. If any symptoms worsen or change, please return to the nearest emergency department.

## 2016-04-02 NOTE — ED Notes (Signed)
Patient transported to CT by this RN, handoff given to brooke miller,RN in [place]

## 2016-04-02 NOTE — Consult Note (Signed)
Neurology Consult Note  Reason for Consultation: CODE STROKE  Requesting provider: Theda Belfast, MD  CC: Right-sided weakness  HPI: This is a 41 year old right-handed woman who initially presented to the emergency department complaining of chest pain. However, in triage, she was noted to have right-sided weakness and code stroke was activated. I arrived in the ED to evaluate the patient with the rest of the stroke team.  The patient reports that she was out house-hunting today with her husband when she suddenly experienced chest pain and right-sided weakness. She describes weakness involving the face, arm, and leg on the right side with no ability to move the right arm and leg. She states that she has never had symptoms like this before. She reports that she was in her usual state of health prior to onset of symptoms though did feel "funny" earlier today. She has difficulty characterizing this any further. She endorses being under significant stress recently. She was taken for emergent CT scan of the head which did not show any acute intracranial abnormality. On examination, she initially demonstrated flaccid weakness of the right arm and leg. However, with distraction, strength is actually normal. Presentation was felt to be most consistent with nonphysiologic weakness and the code stroke was canceled. She is not a candidate for thrombolytic therapy.  According to the patient she has a history of seizures. She is unable to tell me anything about her seizures because she says she cannot remember them. 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 She reports that she is supposed to be taking Keppra but ran out about a month ago and has not been able to refill it because of financial issues.  Last known well: 1630 NIH stroke scale score: 6 (right-sided weakness), the nonphysiologic in nature TPA given?: No,  presentation not consistent with ischemic stroke due to nonphysiologic nature of her symptoms.  PMH:  Past Medical History:  Diagnosis Date  . Bipolar 1 disorder (HCC)    Manic - depressive.  . 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 Hypertension   . Seizures (HCC)    Reportedly "deep brain seizures " - has some right-sided weakness (decreased sensation of facial nerve) over the seizures with some intermittent Bell's palsy symptoms.    PSH:  Past Surgical History:  Procedure Laterality Date  . CESAREAN SECTION  10/31/1992, 03/02/1996.  Marland Kitchen KNEE SURGERY Left 05/08/2013    Family history: Family History  Problem Relation Age of Onset  . Heart attack Mother   . Heart attack Father   . Healthy Sister   . Psychiatric Illness Brother     Multiple personality disorder  . Healthy Brother   . Healthy Brother   . Healthy Sister   . Healthy Sister   . Healthy Sister   . Diabetes Mellitus I Son   . Bone cancer Paternal Grandmother   . Lung cancer Paternal Grandfather     Social history:  Social History   Social History  . Marital status: Married    Spouse name: N/A  . Number of children: 2  . Years of education: GED   Occupational History  . Disabled     Secondary to seizures   Social History Main Topics  . Smoking status: Current Every Day Smoker    Packs/day: 0.50    Years: 15.00    Types: Cigarettes  . Smokeless tobacco:  Never Used  . Alcohol use No  . Drug use: No  . Sexual activity: Yes    Partners: Male   Other Topics Concern  . Not on file   Social History Narrative   She is married mother of 2 with one stepson. Her first child was from a different father. She lives with her husband and son. [place]   She has been smoking for about 15 years.   She walks quite a bit several days a week at least 1-1/2-2 hours at a slow pace.    Current inpatient meds: Medications  reviewed and reconciled.  No current facility-administered medications for this encounter.    Current Outpatient Prescriptions  Medication Sig Dispense Refill  . acetaminophen (TYLENOL) 500 MG tablet Take 500-1,000 mg by mouth every 6 (six) hours as needed for headache.    . albuterol (PROAIR HFA) 108 (90 Base) MCG/ACT inhaler Inhale 1-2 puffs into the lungs every 6 (six) hours as needed for wheezing or shortness of breath. 1 Inhaler 1  . diphenhydrAMINE (BENADRYL) 25 mg capsule Take 25 mg by mouth every 6 (six) hours as needed for itching or allergies.    Marland Kitchen. ibuprofen (ADVIL,MOTRIN) 600 MG tablet Take 1 tablet (600 mg total) by mouth 2 (two) times daily as needed. 60 tablet 1  . levETIRAcetam (KEPPRA) 1000 MG tablet Take 1 tablet (1,000 mg total) by mouth 2 (two) times daily. 60 tablet 1    Allergies: Allergies  Allergen Reactions  . Cinnamon Anaphylaxis  . Penicillins Anaphylaxis and Other (See Comments)    Has patient had a PCN reaction causing immediate rash, facial/tongue/throat swelling, SOB or lightheadedness with hypotension: Yes Has patient had a PCN reaction causing severe rash involving mucus membranes or skin necrosis: Unknown Has patient had a PCN reaction that required hospitalization: Yes Has patient had a PCN reaction occurring within the last 10 years: No If all of the above answers are "NO", then may proceed with Cephalosporin use. "Ended up in an O2 tent"   . Latex Rash    RASH TURNS INTO WELTS  . Other Itching and Rash    GREEN (BELL) PEPPERS   . Tape Rash    RASH TURNS INTO WELTS    ROS: As per HPI. A full 14-point review of systems was performed and is otherwise notable for substernal chest pain. The remainder of review of systems is unremarkable.  PE:  No vital signs have been documented the time of my note  General: WDWN and mild distress with apparent anxiety. AAO x4. Speech clear, no dysarthria. No aphasia. Follows commands briskly.  HEENT:  Normocephalic. Neck supple without LAD. MMM, OP clear. Dentition good. Sclerae anicteric. No conjunctival injection.  CV: Regular, no murmur. Carotid pulses full and symmetric, no bruits. Distal pulses 2+ and symmetric.  Lungs: CTAB.  Abdomen: Soft, non-distended, non-tender. Bowel sounds present x4.  Extremities: No C/C/E. Neuro:  CN: Pupils are equal and round. They are symmetrically reactive from 3-->2 mm. EOMI without nystagmus. No reported diplopia. Facial sensation is intact to light touch. Face is symmetric at rest with normal strength and mobility. However, she does have some inconsistent twisting of the right side of her mouth which sometimes moves to the left side of her mouth while other times resolving completely when distracted and talking. Hearing is intact to conversational voice. Palate elevates symmetrically and uvula is midline. Voice is normal in tone, pitch and quality. Bilateral SCM  and trapezii are 5/5. Tongue is midline with normal bulk and mobility.  Motor: Normal bulk, tone. She initially was unable to move her right arm or leg at all. She kept her right fist tightly clenched and this became even tighter as I tried to open her fingers. Although she cannot lift her right arm on command, I held her right arm up while performing finger-nose the left and she was able to maintain the right arm in the air without any difficulty at all. Similarly, she could raise the right leg off the bed with distraction. At times, she had contraction of antagonistic muscles during strength testing. She had a positive Hoover's on the right. No tremor or other abnormal movements. No drift.  Sensation: Intact to light touch.  DTRs: 2+, symmetric. No pathologic reflexes.  Coordination: Finger-to-nose without dysmetria.   Labs:  Lab Results  Component Value Date   WBC 10.3 04/02/2016   HGB 13.0 04/02/2016   HCT 39.0 04/02/2016   PLT 216 04/02/2016   GLUCOSE 82 11/05/2015   NA 138 11/05/2015   K  3.9 11/05/2015   CL 113 (H) 11/05/2015   CREATININE 0.66 11/05/2015   BUN 6 11/05/2015   CO2 18 (L) 11/05/2015    Imaging:  I have personally and independently reviewed the CT scan of the head without contrast from today. There is no acute abnormality. There are atherosclerotic calcifications of the carotid arteries bilaterally. This was discussed with the interpreting radiologist at the time of consultation.  Assessment and Plan:  1. Right hemiparesis: This was acute onset earlier today. Examination demonstrates nonphysiologic weakness consistent with somatization. Considerations would include conversion disorder, factitious disorder, or malingering. I do not see any evidence to suggest that she has had an acute stroke today. I discussed the relationship between stress and neurologic symptoms with the patient. She endorses being under extreme amounts of stress lately as she is trying to buy a house. Reassurance was provided. Appropriate stress management is encouraged. No need for further diagnostic testing at this time.  2. Seizure disorder: She has reported history of seizures. I reviewed a note from October 2017 where she presented to the emergency department after a seizure. At that time, she told the ED provider that she has been having seizures since 2009. She was living in [place] the time and was followed by a neurologist there. She had run out of Keppra a week prior to her presentation due to financial issues. She also reported that she was not able see a neurologist after moving to [facility] because she didn't have the money. She went on to say that she was originally told she had pseudoseizures many years ago but then "found a new neurologist in [place] who told her that she had deep brain seizures." At the time she was complaining of chest pain that he been present for 3 years. 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 I would recommend that she establish follow-up with an outpatient neurologist for further evaluation and management.  This was discussed with the patient and she is in agreement with the plan as stated. She was given the opportunity to ask questions and these were addressed to her satisfaction.  I discussed my impression and recommendations with the ED physician, Dr. Reino Bellis.  At this time I have no additional recommendations and will sign off. Please call with any further questions or concerns.

## 2016-04-02 NOTE — ED Notes (Signed)
Helped patient with the bedpan. She was able to lift her hips with both legs and used both arms to help get her pants off.

## 2016-04-02 NOTE — ED Triage Notes (Signed)
Pt reports chest pain on and off all day, upon entering triage, pt presents with right arm weakness and right facial droop, family reports LSN 1630. Pt a/ox4.

## 2016-04-02 NOTE — ED Notes (Signed)
CBG 105 

## 2016-04-02 NOTE — Code Documentation (Signed)
41 y.o. Female w/ PMH of HTN, bipolar and seizures presents [facility] via [facility]OV. Pt states she woke up this morning and "felt funny". She continued about her day until 1630 when she had a sudden onset of chest pain, right sided hemiparesis, blurry vision in right eye and right sided sensory loss. She then proceeded to the ED. Upon arrival, pt was triaged, code stroke called and taken to CT. CT negative. tPA not given d/t stroke not suspected. NIHSS 11. See EMR for code stroke times and NIHSS. On assessment pt with right sided hemiparesis, slight right facial droop, right sided sensory loss and inattention of right side. Bedside handoff with ED RN Kirt BoysMolly

## 2016-04-02 NOTE — ED Provider Notes (Signed)
[facility]-EMERGENCY DEPT Provider Note   CSN: [id number] Arrival date & time: 04/02/16  1739     History   Chief Complaint Chief Complaint  Patient presents with  . Chest Pain  . Code Stroke    HPI Denise Bennett is a 41 y.o. female with past medical history significant for hypertension, costochondritis, bipolar disorder, and seizures who presents for chest pain but was activated as a code stroke when she was noticed to have right-sided weakness/numbness and facial droop. Patient reports that her last normal was at 4:30 PM. Patient was out house hunting when she had onset of some chest pain. She describes the pain is moderate and stabbing in her chest. She came to the ED for evaluation and was seen to have a right-sided facial droop, right arm and right leg weakness and right sided numbness. Patient reports that she has had these symptoms in the past. Documentation reports that patient has had symptoms related to her seizures. Reports that she has not taken Her in approximately one month. She reports that she has increase in stress as she has been house hunting. She denies any recent fevers, chills. She denies shortness of breath. She denies nausea or vomiting.   Patient was quickly taken to the CT scanner for code stroke activation.     HPI  Past Medical History:  Diagnosis Date  . Bipolar 1 disorder (HCC)    Manic - depressive.  . 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 Hypertension   . Seizures (HCC)    Reportedly "deep brain seizures " - has some right-sided weakness (decreased sensation of facial nerve) over the seizures with some intermittent Bell's palsy symptoms.    Patient Active Problem List   Diagnosis Date Noted  . Chest pain with moderate risk for cardiac etiology 12/09/2015  . Palpitations 12/09/2015  . Costochondritis 12/04/2015  . Seizures (HCC) 11/06/2015  . Left bundle branch block (LBBB) on electrocardiogram 11/06/2015    Past  Surgical History:  Procedure Laterality Date  . CESAREAN SECTION  10/31/1992, 03/02/1996.  Marland Kitchen. KNEE SURGERY Left 05/08/2013    OB History    No data available       Home Medications    Prior to Admission medications   Medication Sig Start Date End Date Taking? Authorizing Provider  acetaminophen (TYLENOL) 500 MG tablet Take 500-1,000 mg by mouth every 6 (six) hours as needed for headache.    Historical Provider, MD  albuterol (PROAIR HFA) 108 (90 Base) MCG/ACT inhaler Inhale 1-2 puffs into the lungs every 6 (six) hours as needed for wheezing or shortness of breath. 11/06/15   Jaclyn ShaggyEnobong Amao, MD  diphenhydrAMINE (BENADRYL) 25 mg capsule Take 25 mg by mouth every 6 (six) hours as needed for itching or allergies.    Historical Provider, MD  ibuprofen (ADVIL,MOTRIN) 600 MG tablet Take 1 tablet (600 mg total) by mouth 2 (two) times daily as needed. 12/04/15   Jaclyn ShaggyEnobong Amao, MD  levETIRAcetam (KEPPRA) 1000 MG tablet Take 1 tablet (1,000 mg total) by mouth 2 (two) times daily. 12/04/15   Jaclyn ShaggyEnobong Amao, MD    Family History Family History  Problem Relation Age of Onset  . Heart attack Mother   . Heart attack Father   . Healthy Sister   . Psychiatric Illness Brother     Multiple personality disorder  . Healthy Brother   . Healthy Brother   . Healthy Sister   . Healthy Sister   . Healthy Sister   .  Diabetes Mellitus I Son   . Bone cancer Paternal Grandmother   . Lung cancer Paternal Grandfather     Social History Social History  Substance Use Topics  . Smoking status: Current Every Day Smoker    Packs/day: 0.50    Years: 15.00    Types: Cigarettes  . Smokeless tobacco: Never Used  . Alcohol use No     Allergies   Cinnamon; Penicillins; Latex; Other; and Tape   Review of Systems Review of Systems  Constitutional: Negative for activity change, chills, diaphoresis, fatigue and fever.  HENT: Negative for congestion and rhinorrhea.   Eyes: Negative for visual disturbance.    Respiratory: Positive for chest tightness. Negative for cough, shortness of breath and stridor.   Cardiovascular: Positive for chest pain. Negative for palpitations and leg swelling.  Gastrointestinal: Negative for abdominal distention, abdominal pain, constipation, diarrhea, nausea and vomiting.  Genitourinary: Negative for difficulty urinating, dysuria, flank pain, frequency, hematuria, menstrual problem, pelvic pain, vaginal bleeding and vaginal discharge.  Musculoskeletal: Negative for back pain and neck pain.  Skin: Negative for rash and wound.  Neurological: Positive for seizures, facial asymmetry, weakness, numbness and headaches. Negative for dizziness, syncope and light-headedness.  Psychiatric/Behavioral: Negative for agitation and confusion.  All other systems reviewed and are negative.    Physical Exam Updated Vital Signs BP 133/85   Pulse 62   Temp 98.2 F (36.8 C)   Resp 13   SpO2 100%   Physical Exam  Constitutional: She is oriented to person, place, and time. She appears well-developed and well-nourished. No distress.  HENT:  Head: Normocephalic and atraumatic.  Right Ear: External ear normal.  Left Ear: External ear normal.  Nose: Nose normal.  Mouth/Throat: Oropharynx is clear and moist. No oropharyngeal exudate.  Eyes: Conjunctivae and EOM are normal. Pupils are equal, round, and reactive to light.  Neck: Normal range of motion. Neck supple.  Cardiovascular: Normal rate, normal heart sounds and intact distal pulses.   No murmur heard. Pulmonary/Chest: Effort normal. No stridor. No respiratory distress. She has no wheezes. She has no rales. She exhibits no tenderness.  Abdominal: Soft. She exhibits no distension. There is no tenderness. There is no rebound.  Musculoskeletal: She exhibits no tenderness.  Neurological: She is alert and oriented to person, place, and time. She has normal reflexes. She is not disoriented. She displays normal reflexes. A cranial  nerve deficit and sensory deficit is present. She exhibits abnormal muscle tone. She displays no seizure activity. Coordination normal. GCS eye subscore is 4. GCS verbal subscore is 5. GCS motor subscore is 6.  Right-sided weakness in face, arm, and leg. This appears to be secondary to poor effort. When the left arm was distracted, patient held her right arm up.   Skin: Skin is warm. Capillary refill takes less than 2 seconds. No rash noted. She is not diaphoretic. No erythema.  Psychiatric: She has a normal mood and affect.  Nursing note and vitals reviewed.    ED Treatments / Results  Labs (all labs ordered are listed, but only abnormal results are displayed) Labs Reviewed  COMPREHENSIVE METABOLIC PANEL - Abnormal; Notable for the following:       Result Value   Glucose, Bld 106 (*)    Calcium 8.8 (*)    Total Protein 6.1 (*)    Albumin 3.2 (*)    All other components within normal limits  I-STAT CHEM 8, ED - Abnormal; Notable for the following:    Glucose, Bld 103 (*)  Calcium, Ion 1.14 (*)    All other components within normal limits  CBG MONITORING, ED - Abnormal; Notable for the following:    Glucose-Capillary 105 (*)    All other components within normal limits  ETHANOL  PROTIME-INR  APTT  CBC  DIFFERENTIAL  RAPID URINE DRUG SCREEN, HOSP PERFORMED  URINALYSIS, ROUTINE W REFLEX MICROSCOPIC  D-DIMER, QUANTITATIVE (NOT AT [facility])  I-STAT TROPOININ, ED  I-STAT TROPOININ, ED    EKG  EKG Interpretation  Date/Time:  Thursday April 02 2016 17:45:44 EST Ventricular Rate:  73 PR Interval:  118 QRS Duration: 100 QT Interval:  398 QTC Calculation: 438 R Axis:   81 Text Interpretation:  Normal sinus rhythm Inferior infarct , age undetermined Possible Anterior infarct , age undetermined Abnormal ECG When compared to prior, LBBB resovled.  Z6,X0R6 pattern seen.  No STEMI Confirmed by [facility] MD, Rossanna Spitzley [phone]) on 04/02/2016 6:18:24 PM       Radiology Dg Chest 2  View  Result Date: 04/02/2016 CLINICAL DATA:  41 year old presenting with acute onset of right-sided weakness and numbness, chest pain and abnormal EKG upon evaluation in the emergency department. Current smoker. EXAM: CHEST  2 VIEW COMPARISON:  None. FINDINGS: Cardiomediastinal silhouette unremarkable. Mildly prominent bronchovascular markings diffusely and mild central peribronchial thickening. Lungs otherwise clear. No localized airspace consolidation. No pleural effusions. No pneumothorax. Normal pulmonary vascularity. Visualized bony thorax intact. IMPRESSION: Mild changes of bronchitis and/or asthma. No acute cardiopulmonary disease otherwise. Electronically Signed   By: Hulan Saas M.D.   On: 04/02/2016 19:24   Ct Head Code Stroke W/o Cm  Result Date: 04/02/2016 CLINICAL DATA:  Code stroke. RIGHT-sided weakness. History of seizures, hypertension, bipolar disorder. EXAM: CT HEAD WITHOUT CONTRAST TECHNIQUE: Contiguous axial images were obtained from the base of the skull through the vertex without intravenous contrast. COMPARISON:  None. FINDINGS: BRAIN: The ventricles and sulci are normal. No intraparenchymal hemorrhage, mass effect nor midline shift. No acute large vascular territory infarcts. Beam hardening artifact attenuates the temporal lobes bilaterally. No abnormal extra-axial fluid collections. Basal cisterns are patent. VASCULAR: Mild calcific atherosclerosis of the carotid siphons. SKULL/SOFT TISSUES: No skull fracture. Severe LEFT temporomandibular osteoarthrosis. No significant soft tissue swelling. ORBITS/SINUSES: The included ocular globes and orbital contents are normal. Mild pan paranasal sinus mucosal thickening. Mastoid air cells are well aerated. OTHER: None. ASPECTS [facility] Stroke Program Early CT Score) - Ganglionic level infarction (caudate, lentiform nuclei, internal capsule, insula, M1-M3 cortex): 7 - Supraganglionic infarction (M4-M6 cortex): 3 Total score (0-10 with 10 being  normal): 10 IMPRESSION: 1. No acute intracranial process 2. Mild calcific atherosclerosis. 3. ASPECTS is 10. Critical Value/emergent results were called by telephone at the time of interpretation on 04/02/2016 at 6:08 pm to Dr. Roxy Manns, Neurology, who verbally acknowledged these results. Electronically Signed   By: Awilda Metro M.D.   On: 04/02/2016 18:10    Procedures Procedures (including critical care time)  Medications Ordered in ED Medications - No data to display   Initial Impression / Assessment and Plan / ED Course  I have reviewed the triage vital signs and the nursing notes.  Pertinent labs & imaging results that were available during my care of the patient were reviewed by me and considered in my medical decision making (see chart for details).     Abbigayle Dabbs is a 41 y.o. female with past medical history significant for hypertension, costochondritis, bipolar disorder, and seizures who presents for chest pain but was activated as a code stroke when she  was noticed to have right-sided weakness/numbness and facial droop. Initial glucose is normal.  History and exam are seen above.  On exam, patient has right-sided weakness in the right leg, right arm, and right face. Patient has strength when distracted using her left arm. Patient describes subjective numbness on the right side. Patient has normal extraocular movements and pupils are symmetric. Patient has symmetric palate elevation on exam. Patient has clear lungs and nontender chest. Abdomen is nontender.  CT head code stroke showed no evidence of acute stroke. Neurology came to the bedside and after their evaluation, do not feel patient is having a stroke. They feel patient has psychosomatic type neurologic symptoms. They report that they do not feel she needs any further neurologic workup at this time.  Initial EKG showed resolution of right bundle branch block but did show evidence of S1Q3T3. Patient will have workup for her  chest pain and EKG changes.   Anticipate reassessment following workup.  Diagnostic testing returned showing unremarkable chest x-ray. Laboratory testing grossly unremarkable. Troponin negative 2. D-dimer negative.  Even reassuring workup, patient was reassessed. Patient had resolution of her numbness, tingling, and weakness. Patient had full use of her right side. Patient had no more chest pain. Due to symptoms tonight, patient will be referred to neurology for further establish management of her problems. Patient reports that she has not seen a neurologist in [place] yet to help manage her seizures. Patient reports that she will start taking the Keppra which she was prescribed previously. Patient will follow-up with a PCP for further management of her chest pain. Given the reassuring workup, do not feel patient has a cardiac etiology of chest pain today.  Patient had resolution of symptoms that brought her to the emergency department. Patient had no other questions or concerns and patient was discharged in good condition with understanding of return precautions and follow-up planning.   Final Clinical Impressions(s) / ED Diagnoses   Final diagnoses:  Precordial pain  Right sided weakness    New Prescriptions Discharge Medication List as of 04/02/2016 10:24 PM      Clinical Impression: 1. Precordial pain   2. Right sided weakness     Disposition: Discharge  Condition: Good  I have discussed the results, Dx and Tx plan with the pt(& family if present). He/she/they expressed understanding and agree(s) with the plan. Discharge instructions discussed at great length. Strict return precautions discussed and pt &/or family have verbalized understanding of the instructions. No further questions at time of discharge.    Discharge Medication List as of 04/02/2016 10:24 PM      Follow Up: [facility] [address]     [address] [place]  [address] [phone]    Jaclyn Shaggy, MD [address] [place] [address] [phone]     [facility] EMERGENCY DEPARTMENT [address] [id number] [place]he[place] [address] [phone]  If symptoms worsen     Heide Scales, MD 04/02/16 2308

## 2016-06-03 ENCOUNTER — Ambulatory Visit: Payer: Self-pay | Attending: Family Medicine | Admitting: Family Medicine

## 2016-06-03 ENCOUNTER — Encounter: Payer: Self-pay | Admitting: Family Medicine

## 2016-06-03 DIAGNOSIS — R569 Unspecified convulsions: Secondary | ICD-10-CM

## 2016-06-03 DIAGNOSIS — F319 Bipolar disorder, unspecified: Secondary | ICD-10-CM | POA: Insufficient documentation

## 2016-06-03 DIAGNOSIS — I10 Essential (primary) hypertension: Secondary | ICD-10-CM | POA: Insufficient documentation

## 2016-06-03 DIAGNOSIS — Z9114 Patient's other noncompliance with medication regimen: Secondary | ICD-10-CM | POA: Insufficient documentation

## 2016-06-03 DIAGNOSIS — J45909 Unspecified asthma, uncomplicated: Secondary | ICD-10-CM | POA: Insufficient documentation

## 2016-06-03 DIAGNOSIS — Z880 Allergy status to penicillin: Secondary | ICD-10-CM | POA: Insufficient documentation

## 2016-06-03 DIAGNOSIS — Z9119 Patient's noncompliance with other medical treatment and regimen: Secondary | ICD-10-CM | POA: Insufficient documentation

## 2016-06-03 DIAGNOSIS — J4520 Mild intermittent asthma, uncomplicated: Secondary | ICD-10-CM

## 2016-06-03 DIAGNOSIS — G253 Myoclonus: Secondary | ICD-10-CM | POA: Insufficient documentation

## 2016-06-03 DIAGNOSIS — I447 Left bundle-branch block, unspecified: Secondary | ICD-10-CM | POA: Insufficient documentation

## 2016-06-03 MED ORDER — ALBUTEROL SULFATE HFA 108 (90 BASE) MCG/ACT IN AERS: 1.0000 | INHALATION_SPRAY | Freq: Four times a day (QID) | RESPIRATORY_TRACT | 3 refills | Status: DC | PRN

## 2016-06-03 MED ORDER — LEVETIRACETAM 1000 MG PO TABS: 1000.0000 mg | ORAL_TABLET | Freq: Two times a day (BID) | ORAL | 3 refills | Status: DC

## 2016-06-03 MED FILL — levETIRAcetam 1000 MG TABS: 1000 | 30 days supply | Qty: 60 | Fill #0

## 2016-06-03 MED FILL — !VENTOLIN HFA INHALER: 108 (90 BAS | 25 days supply | Qty: 18 | Fill #0

## 2016-06-03 NOTE — Patient Instructions (Signed)
Epilepsy °Epilepsy is a condition in which a person has repeated seizures over time. A seizure is a sudden burst of abnormal electrical and chemical activity in the brain. Seizures can cause a change in attention, behavior, or the ability to remain awake and alert (altered mental status). °Epilepsy increases a person's risk of falls, accidents, and injury. It can also lead to complications, including: °· Depression. °· Poor memory. °· Sudden unexplained death in epilepsy (SUDEP). This complication is rare, and its cause is not known. °Most people with epilepsy lead normal lives. °What are the causes? °This condition may be caused by: °· A head injury. °· An injury that happens at birth. °· A high fever during childhood. °· A stroke. °· Bleeding that goes into or around the brain. °· Certain medicines and drugs. °· Having too little oxygen for a long period of time. °· Abnormal brain development. °· Certain infections, such as meningitis and encephalitis. °· Brain tumors. °· Conditions that are passed along from parent to child (are hereditary). °What are the signs or symptoms? °Symptoms of a seizure vary greatly from person to person. They include: °· Convulsions. °· Stiffening of the body. °· Involuntary movements of the arms or legs. °· Loss of consciousness. °· Breathing problems. °· Falling suddenly. °· Confusion. °· Head nodding. °· Eye blinking or fluttering. °· Lip smacking. °· Drooling. °· Rapid eye movements. °· Grunting. °· Loss of bladder control and bowel control. °· Staring. °· Unresponsiveness. °Some people have symptoms right before a seizure happens (aura) and right after a seizure happens. Symptoms of an aura include: °· Fear or anxiety. °· Nausea. °· Feeling like the room is spinning (vertigo). °· A feeling of having seen or heard something before (deja vu). °· Odd tastes or smells. °· Changes in vision, such as seeing flashing lights or spots. °Symptoms that follow a seizure  include: °· Confusion. °· Sleepiness. °· Headache. °How is this diagnosed? °This condition is diagnosed based on: °· Your symptoms. °· Your medical history. °· A physical exam. °· A neurological exam. A neurological exam is similar to a physical exam. It involves checking your strength, reflexes, coordination, and sensations. °· Tests, such as: °¨ An electroencephalogram (EEG). This is a painless test that creates a diagram of your brain waves. °¨ An MRI of the brain. °¨ A CT scan of the brain. °¨ A lumbar puncture, also called a spinal tap. °¨ Blood tests to check for signs of infection or abnormal blood chemistry. °How is this treated? °There is no cure for this condition, but treatment can help control seizures. Treatment may involve: °· Taking medicines to control seizures. These include medicines to prevent seizures and medicines to stop seizures as they occur. °· Having a device called a vagus nerve stimulator implanted in the chest. The device sends electrical impulses to the vagus nerve and to the brain to prevent seizures. This treatment may be recommended if medicines do not help. °· Brain surgery. There are several kinds of surgeries that may be done to stop seizures from happening or to reduce how often seizures happen. °· Having regular blood tests. You may need to have blood tests regularly to check that you are getting the right amount of medicine. °Once this condition has been diagnosed, it is important to begin treatment as soon as possible. For some people, epilepsy eventually goes away. °Follow these instructions at home: °Medicines  ° °· Take over-the-counter and prescription medicines only as told by your health care provider. °·   Avoid any substances that may prevent your medicine from working properly, such as alcohol. °Activity  °· Get enough rest. Lack of sleep can make seizures more likely to occur. °· Follow instructions from your health care provider about driving, swimming, and doing any  other activities that would be dangerous if you had a seizure. °Educating others  °Teach friends and family what to do if you have a seizure. They should: °· Lay you on the ground to prevent a fall. °· Cushion your head and body. °· Loosen any tight clothing around your neck. °· Turn you on your side. If vomiting occurs, this helps keep your airway clear. °· Stay with you until you recover. °· Not hold you down. Holding you down will not stop the seizure. °· Not put anything in your mouth. °· Know whether or not you need emergency care. °General instructions  °· Avoid anything that has ever triggered a seizure for you. °· Keep a seizure diary. Record what you remember about each seizure, especially anything that might have triggered the seizure. °· Keep all follow-up visits as told by your health care provider. This is important. °Contact a health care provider if: °· Your seizure pattern changes. °· You have symptoms of infection or another illness. This might increase your risk of having a seizure. °Get help right away if: °· You have a seizure that does not stop after 5 minutes. °· You have several seizures in a row without a complete recovery in between seizures. °· You have a seizure that makes it harder to breathe. °· You have a seizure that is different from previous seizures. °· You have a seizure that leaves you unable to speak or use a part of your body. °· You did not wake up immediately after a seizure. °This information is not intended to replace advice given to you by your health care provider. Make sure you discuss any questions you have with your health care provider. °Document Released: 01/12/2005 Document Revised: 08/10/2015 Document Reviewed: 07/23/2015 °[employer] Interactive Patient Education © 2017 [employer] ° °

## 2016-06-03 NOTE — Progress Notes (Signed)
Subjective:  Patient ID: Denise Bennett, female    DOB: 06/09/1975  Age: 41 y.o. MRN: [id number]  CC: Follow-up   HPI Denise Bennett is a 41 year old female with a history of seizures, asthma who has been noncompliant with her Keppra due to cost. She has been homeless for some time and been unable to obtain her medications or afford meals. She did have one episode of a partial seizure with twitching of her mouth a few months back.  She was seen by cardiology due to presence of left bundle branch block and the followed last 2 and an event monitor was recommended however she was homeless and could not follow through with this. An exercise stress Myoview also recommended which she never had. Her Asthma is controlled and she rarely has to use her rescue inhaler.  She does not have any concerns at this time but is requesting a letter stating she is unable to work so she can obtain foods stamps.  Past Medical History:  Diagnosis Date  . Bipolar 1 disorder (HCC)    Manic - depressive.  . 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 Hypertension   . Seizures (HCC)    Reportedly "deep brain seizures " - has some right-sided weakness (decreased sensation of facial nerve) over the seizures with some intermittent Bell's palsy symptoms.    Past Surgical History:  Procedure Laterality Date  . CESAREAN SECTION  10/31/1992, 03/02/1996.  Marland Kitchen KNEE SURGERY Left 05/08/2013    Allergies  Allergen Reactions  . Cinnamon Anaphylaxis  . Penicillins Anaphylaxis and Other (See Comments)    Has patient had a PCN reaction causing immediate rash, facial/tongue/throat swelling, SOB or lightheadedness with hypotension: Yes Has patient had a PCN reaction causing severe rash involving mucus membranes or skin necrosis: Unknown Has patient had a PCN reaction that required hospitalization: Yes Has patient had a PCN reaction occurring within the last 10 years: No If all of the above answers are  "NO", then may proceed with Cephalosporin use. "Ended up in an O2 tent"   . Latex Rash    RASH TURNS INTO WELTS  . Other Itching and Rash    GREEN (BELL) PEPPERS   . Tape Rash    RASH TURNS INTO WELTS    Outpatient Medications Prior to Visit  Medication Sig Dispense Refill  . acetaminophen (TYLENOL) 500 MG tablet Take 500-1,000 mg by mouth every 6 (six) hours as needed for headache.    . diphenhydrAMINE (BENADRYL) 25 mg capsule Take 25 mg by mouth every 6 (six) hours as needed for itching or allergies.    Marland Kitchen ibuprofen (ADVIL,MOTRIN) 600 MG tablet Take 1 tablet (600 mg total) by mouth 2 (two) times daily as needed. 60 tablet 1  . albuterol (PROAIR HFA) 108 (90 Base) MCG/ACT inhaler Inhale 1-2 puffs into the lungs every 6 (six) hours as needed for wheezing or shortness of breath. 1 Inhaler 1  . levETIRAcetam (KEPPRA) 1000 MG tablet Take 1 tablet (1,000 mg total) by mouth 2 (two) times daily. 60 tablet 1   No facility-administered medications prior to visit.     ROS Review of Systems  Constitutional: Negative for activity change, appetite change and fatigue.  HENT: Negative for congestion, sinus pressure and sore throat.   Eyes: Negative for visual disturbance.  Respiratory: Negative for cough, chest tightness, shortness of breath and wheezing.   Cardiovascular: Negative for chest pain and palpitations.  Gastrointestinal: Negative for abdominal distention, abdominal pain  and constipation.  Endocrine: Negative for polydipsia.  Genitourinary: Negative for dysuria and frequency.  Musculoskeletal: Negative for arthralgias and back pain.  Skin: Negative for rash.  Neurological: Positive for numbness. Negative for tremors and light-headedness.  Hematological: Does not bruise/bleed easily.  Psychiatric/Behavioral: Negative for agitation and behavioral problems.     Objective:  BP 134/81   Pulse 77   Temp 97.9 F (36.6 C) (Oral)   Wt 202 lb (91.6 kg)   LMP 06/03/2016   SpO2 96%    BMI 34.14 kg/m   BP/Weight 06/03/2016 04/02/2016 12/09/2015  Systolic BP 134 133 136  Diastolic BP 81 85 82  Wt. (Lbs) 202 - 200  BMI 34.14 - 33.8      Physical Exam  Constitutional: She is oriented to person, place, and time. She appears well-developed and well-nourished.  Cardiovascular: Normal rate, normal heart sounds and intact distal pulses.   No murmur heard. Pulmonary/Chest: Effort normal and breath sounds normal. She has no wheezes. She has no rales. She exhibits no tenderness.  Abdominal: Soft. Bowel sounds are normal. She exhibits no distension and no mass. There is no tenderness.  Musculoskeletal: Normal range of motion.  Neurological: She is alert and oriented to person, place, and time.  Skin: Skin is warm and dry.  Psychiatric: She has a normal mood and affect.     CMP Latest Ref Rng & Units 04/02/2016 04/02/2016 11/05/2015  Glucose 65 - 99 mg/dL 829(F103(H) 621(H106(H) 82  BUN 6 - 20 mg/dL 7 7 6   Creatinine 0.44 - 1.00 mg/dL 0.860.80 5.780.82 4.690.66  Sodium 135 - 145 mmol/L 139 137 138  Potassium 3.5 - 5.1 mmol/L 4.0 3.9 3.9  Chloride 101 - 111 mmol/L 105 107 113(H)  CO2 22 - 32 mmol/L - 22 18(L)  Calcium 8.9 - 10.3 mg/dL - 8.8(L) 8.0(L)  Total Protein 6.5 - 8.1 g/dL - 6.1(L) -  Total Bilirubin 0.3 - 1.2 mg/dL - 0.4 -  Alkaline Phos 38 - 126 U/L - 78 -  AST 15 - 41 U/L - 21 -  ALT 14 - 54 U/L - 20 -    Assessment & Plan:   1. Seizures (HCC) Uncontrolled due to noncompliance Advised against driving until seizure free for 6 month Provided a letter for assistance with food stamps. - levETIRAcetam (KEPPRA) 1000 MG tablet; Take 1 tablet (1,000 mg total) by mouth 2 (two) times daily.  Dispense: 60 tablet; Refill: 3  2. Mild intermittent asthma without complication Stable   Meds ordered this encounter  Medications  . levETIRAcetam (KEPPRA) 1000 MG tablet    Sig: Take 1 tablet (1,000 mg total) by mouth 2 (two) times daily.    Dispense:  60 tablet    Refill:  3  . albuterol  (PROAIR HFA) 108 (90 Base) MCG/ACT inhaler    Sig: Inhale 1-2 puffs into the lungs every 6 (six) hours as needed for wheezing or shortness of breath.    Dispense:  1 Inhaler    Refill:  3    Follow-up: Return in about 3 months (around 09/03/2016) for Follow-up on seizures.   Jaclyn ShaggyEnobong Amao MD

## 2016-06-26 ENCOUNTER — Ambulatory Visit: Payer: Self-pay | Attending: Family Medicine

## 2016-06-26 MED FILL — ?LEVETIRACETAM 1000 MG TAB: 1000 MG | 30 days supply | Qty: 60 | Fill #1

## 2016-07-27 ENCOUNTER — Other Ambulatory Visit: Payer: Self-pay | Admitting: *Deleted

## 2016-07-27 MED ORDER — ALBUTEROL SULFATE HFA 108 (90 BASE) MCG/ACT IN AERS: 1.0000 | INHALATION_SPRAY | Freq: Four times a day (QID) | RESPIRATORY_TRACT | 3 refills | Status: DC | PRN

## 2016-07-27 MED FILL — levETIRAcetam 1000 MG TABS: 1000 | 30 days supply | Qty: 60 | Fill #2

## 2016-07-27 NOTE — Telephone Encounter (Signed)
PRINTED FOR PASS PROGRAM 

## 2016-09-07 ENCOUNTER — Ambulatory Visit: Payer: Self-pay | Admitting: Family Medicine

## 2016-09-16 ENCOUNTER — Other Ambulatory Visit: Payer: Self-pay | Admitting: Family Medicine

## 2016-09-16 DIAGNOSIS — R569 Unspecified convulsions: Secondary | ICD-10-CM

## 2016-09-16 MED FILL — levETIRAcetam 1000 MG TABS: 1000 | 30 days supply | Qty: 60 | Fill #3

## 2016-09-18 ENCOUNTER — Encounter: Payer: Self-pay | Admitting: Family Medicine

## 2016-09-18 ENCOUNTER — Ambulatory Visit: Payer: Self-pay | Attending: Family Medicine | Admitting: Family Medicine

## 2016-09-18 ENCOUNTER — Other Ambulatory Visit: Payer: Self-pay

## 2016-09-18 ENCOUNTER — Ambulatory Visit (HOSPITAL_COMMUNITY)
Admission: RE | Admit: 2016-09-18 | Discharge: 2016-09-18 | Disposition: A | Discharge status: Home / Self Care | Payer: Self-pay | Source: Ambulatory Visit | Attending: Family Medicine | Admitting: Family Medicine

## 2016-09-18 VITALS — BP 137/84 | HR 82 | Temp 98.4°F | Resp 18 | Ht 64.0 in | Wt 199.0 lb

## 2016-09-18 DIAGNOSIS — Z880 Allergy status to penicillin: Secondary | ICD-10-CM | POA: Insufficient documentation

## 2016-09-18 DIAGNOSIS — Z9119 Patient's noncompliance with other medical treatment and regimen: Secondary | ICD-10-CM | POA: Insufficient documentation

## 2016-09-18 DIAGNOSIS — R569 Unspecified convulsions: Secondary | ICD-10-CM | POA: Insufficient documentation

## 2016-09-18 DIAGNOSIS — Z91040 Latex allergy status: Secondary | ICD-10-CM | POA: Insufficient documentation

## 2016-09-18 DIAGNOSIS — Z9109 Other allergy status, other than to drugs and biological substances: Secondary | ICD-10-CM | POA: Insufficient documentation

## 2016-09-18 DIAGNOSIS — Z91018 Allergy to other foods: Secondary | ICD-10-CM | POA: Insufficient documentation

## 2016-09-18 DIAGNOSIS — R0789 Other chest pain: Secondary | ICD-10-CM

## 2016-09-18 DIAGNOSIS — I10 Essential (primary) hypertension: Secondary | ICD-10-CM | POA: Insufficient documentation

## 2016-09-18 DIAGNOSIS — M940 Chondrocostal junction syndrome [Tietze]: Secondary | ICD-10-CM | POA: Insufficient documentation

## 2016-09-18 DIAGNOSIS — Z9114 Patient's other noncompliance with medication regimen: Secondary | ICD-10-CM | POA: Insufficient documentation

## 2016-09-18 DIAGNOSIS — J4520 Mild intermittent asthma, uncomplicated: Secondary | ICD-10-CM | POA: Insufficient documentation

## 2016-09-18 DIAGNOSIS — Z9889 Other specified postprocedural states: Secondary | ICD-10-CM | POA: Insufficient documentation

## 2016-09-18 MED ORDER — ALBUTEROL SULFATE HFA 108 (90 BASE) MCG/ACT IN AERS: 1.0000 | INHALATION_SPRAY | Freq: Four times a day (QID) | RESPIRATORY_TRACT | 3 refills | Status: DC | PRN

## 2016-09-18 MED ORDER — LEVETIRACETAM 1000 MG PO TABS: 1000.0000 mg | ORAL_TABLET | Freq: Two times a day (BID) | ORAL | 3 refills | Status: DC

## 2016-09-18 MED ORDER — MELOXICAM 7.5 MG PO TABS: 7.5000 mg | ORAL_TABLET | Freq: Every day | ORAL | 3 refills | Status: DC

## 2016-09-18 NOTE — Progress Notes (Signed)
Subjective:  Patient ID: Denise Bennett, female    DOB: 02-26-1975  Age: 41 y.o. MRN: [id number]  CC: Seizures   HPI Denise Bennett is a 41 year old female with a history of seizures, asthma, costochondritis. who presents today for follow-up visit. She complains her seizures have been uncontrolled with her last seizure last week despite compliance with her Keppra.  Also complains of parasternal chest pain which she had last week and felt like a tunnel bricks on her chest; this felt different from her costochondritis. Denies shortness of breath, diaphoresis, nausea or vomiting at that time. She was seen by cardiology in 11/2016 due to presence of left bundle branch block and an event monitor was recommended however she was homeless and could not follow through with this. An exercise stress Myoview also recommended which she never had.  She does have costochondritis which presents as right-sided chest wall pain and reports no improvement despite use of ibuprofen. Her Asthma is controlled and she rarely has to use her rescue inhaler.   Past Medical History:  Diagnosis Date  . Bipolar 1 disorder (HCC)    Manic - depressive.  . 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 Hypertension   . Seizures (HCC)    Reportedly "deep brain seizures " - has some right-sided weakness (decreased sensation of facial nerve) over the seizures with some intermittent Bell's palsy symptoms.    Past Surgical History:  Procedure Laterality Date  . CESAREAN SECTION  10/31/1992, 03/02/1996.  Marland Kitchen KNEE SURGERY Left 05/08/2013    Allergies  Allergen Reactions  . Cinnamon Anaphylaxis  . Penicillins Anaphylaxis and Other (See Comments)    Has patient had a PCN reaction causing immediate rash, facial/tongue/throat swelling, SOB or lightheadedness with hypotension: Yes Has patient had a PCN reaction causing severe rash involving mucus membranes or skin necrosis: Unknown Has patient had a PCN  reaction that required hospitalization: Yes Has patient had a PCN reaction occurring within the last 10 years: No If all of the above answers are "NO", then may proceed with Cephalosporin use. "Ended up in an O2 tent"   . Latex Rash    RASH TURNS INTO WELTS  . Other Itching and Rash    GREEN (BELL) PEPPERS   . Tape Rash    RASH TURNS INTO WELTS     Outpatient Medications Prior to Visit  Medication Sig Dispense Refill  . acetaminophen (TYLENOL) 500 MG tablet Take 500-1,000 mg by mouth every 6 (six) hours as needed for headache.    . diphenhydrAMINE (BENADRYL) 25 mg capsule Take 25 mg by mouth every 6 (six) hours as needed for itching or allergies.    Marland Kitchen albuterol (PROAIR HFA) 108 (90 Base) MCG/ACT inhaler Inhale 1-2 puffs into the lungs every 6 (six) hours as needed for wheezing or shortness of breath. 1 Inhaler 3  . albuterol (PROVENTIL HFA;VENTOLIN HFA) 108 (90 Base) MCG/ACT inhaler Inhale 1-2 puffs into the lungs every 6 (six) hours as needed for wheezing or shortness of breath. 54 g 3  . ibuprofen (ADVIL,MOTRIN) 600 MG tablet Take 1 tablet (600 mg total) by mouth 2 (two) times daily as needed. 60 tablet 1  . levETIRAcetam (KEPPRA) 1000 MG tablet Take 1 tablet (1,000 mg total) by mouth 2 (two) times daily. 60 tablet 3   No facility-administered medications prior to visit.     ROS Review of Systems  Constitutional: Negative for activity change, appetite change and fatigue.  HENT: Negative for congestion,  sinus pressure and sore throat.   Eyes: Negative for visual disturbance.  Respiratory: Negative for cough, chest tightness, shortness of breath and wheezing.   Cardiovascular: Positive for chest pain. Negative for palpitations.  Gastrointestinal: Negative for abdominal distention, abdominal pain and constipation.  Endocrine: Negative for polydipsia.  Genitourinary: Negative for dysuria and frequency.  Musculoskeletal: Negative for arthralgias and back pain.  Skin: Negative for  rash.  Neurological: Positive for seizures. Negative for tremors, light-headedness and numbness.  Hematological: Does not bruise/bleed easily.  Psychiatric/Behavioral: Negative for agitation and behavioral problems.    Objective:  BP 137/84 (BP Location: Left Arm, Patient Position: Sitting, Cuff Size: Normal)   Pulse 82   Temp 98.4 F (36.9 C) (Oral)   Resp 18   Ht 5\' 4"  (1.626 m)   Wt 199 lb (90.3 kg)   LMP 09/09/2016   SpO2 98%   BMI 34.16 kg/m   BP/Weight 09/18/2016 06/03/2016 04/02/2016  Systolic BP 137 134 133  Diastolic BP 84 81 85  Wt. (Lbs) 199 202 -  BMI 34.16 34.14 -      Physical Exam  Constitutional: She is oriented to person, place, and time. She appears well-developed and well-nourished.  Cardiovascular: Normal rate, normal heart sounds and intact distal pulses.   No murmur heard. Pulmonary/Chest: Effort normal and breath sounds normal. She has no wheezes. She has no rales. She exhibits tenderness (right-sided chest wall tenderness ).  Abdominal: Soft. Bowel sounds are normal. She exhibits no distension and no mass. There is no tenderness.  Musculoskeletal: Normal range of motion.  Neurological: She is alert and oriented to person, place, and time.  Skin: Skin is warm and dry.  Psychiatric: She has a normal mood and affect.     Assessment & Plan:   1. Seizures (HCC) Uncontrolled - levETIRAcetam (KEPPRA) 1000 MG tablet; Take 1 tablet (1,000 mg total) by mouth 2 (two) times daily.  Dispense: 60 tablet; Refill: 3 - Basic Metabolic Panel - Ambulatory referral to Neurology  2. Costochondritis Uncontrolled on ibuprofen Commence meloxicam - meloxicam (MOBIC) 7.5 MG tablet; Take 1 tablet (7.5 mg total) by mouth daily.  Dispense: 30 tablet; Refill: 3  3. Mild intermittent asthma without complication Stable - albuterol (PROVENTIL HFA;VENTOLIN HFA) 108 (90 Base) MCG/ACT inhaler; Inhale 1-2 puffs into the lungs every 6 (six) hours as needed for wheezing or  shortness of breath.  Dispense: 54 g; Refill: 3 - albuterol (PROAIR HFA) 108 (90 Base) MCG/ACT inhaler; Inhale 1-2 puffs into the lungs every 6 (six) hours as needed for wheezing or shortness of breath.  Dispense: 1 Inhaler; Refill: 3  4. Other chest pain EKG reveals nonspecific ST changes Noncompliant with Cardiology follow up. - Ambulatory referral to Cardiology   Meds ordered this encounter  Medications  . levETIRAcetam (KEPPRA) 1000 MG tablet    Sig: Take 1 tablet (1,000 mg total) by mouth 2 (two) times daily.    Dispense:  60 tablet    Refill:  3  . albuterol (PROVENTIL HFA;VENTOLIN HFA) 108 (90 Base) MCG/ACT inhaler    Sig: Inhale 1-2 puffs into the lungs every 6 (six) hours as needed for wheezing or shortness of breath.    Dispense:  54 g    Refill:  3  . albuterol (PROAIR HFA) 108 (90 Base) MCG/ACT inhaler    Sig: Inhale 1-2 puffs into the lungs every 6 (six) hours as needed for wheezing or shortness of breath.    Dispense:  1 Inhaler  Refill:  3  . meloxicam (MOBIC) 7.5 MG tablet    Sig: Take 1 tablet (7.5 mg total) by mouth daily.    Dispense:  30 tablet    Refill:  3    Follow-up: 3 months follow-up on chronic medical conditions  Jaclyn Shaggy MD

## 2016-09-19 LAB — BASIC METABOLIC PANEL WITH GFR
BUN/Creatinine Ratio: 9 (ref 9–23)
BUN: 7 mg/dL (ref 6–24)
CO2: 20 mmol/L (ref 20–29)
Calcium: 9.2 mg/dL (ref 8.7–10.2)
Chloride: 105 mmol/L (ref 96–106)
Creatinine, Ser: 0.81 mg/dL (ref 0.57–1.00)
GFR calc Af Amer: 104 mL/min/1.73
GFR calc non Af Amer: 90 mL/min/1.73
Glucose: 88 mg/dL (ref 65–99)
Potassium: 4.3 mmol/L (ref 3.5–5.2)
Sodium: 140 mmol/L (ref 134–144)

## 2016-09-21 ENCOUNTER — Telehealth: Payer: Self-pay

## 2016-09-21 NOTE — Telephone Encounter (Signed)
Pt was called and informed of lab results. 

## 2016-09-29 MED FILL — $VENTOLIN HFA 18G INHALER: 108 (90 BAS | 25 days supply | Qty: 18 | Fill #0

## 2016-09-29 MED FILL — MELOXICAM 7.5 MG TABLET: 7.5 | 30 days supply | Qty: 30 | Fill #0

## 2016-10-05 ENCOUNTER — Ambulatory Visit: Payer: Self-pay | Attending: Family Medicine

## 2016-10-07 ENCOUNTER — Encounter: Payer: Self-pay | Admitting: Neurology

## 2016-10-15 ENCOUNTER — Emergency Department (HOSPITAL_COMMUNITY)
Admission: EM | Admit: 2016-10-15 | Discharge: 2016-10-15 | Disposition: A | Discharge status: Home / Self Care | Payer: Self-pay | Attending: Emergency Medicine | Admitting: Emergency Medicine

## 2016-10-15 ENCOUNTER — Encounter (HOSPITAL_COMMUNITY): Payer: Self-pay | Admitting: Emergency Medicine

## 2016-10-15 DIAGNOSIS — F17210 Nicotine dependence, cigarettes, uncomplicated: Secondary | ICD-10-CM | POA: Insufficient documentation

## 2016-10-15 DIAGNOSIS — R569 Unspecified convulsions: Secondary | ICD-10-CM | POA: Insufficient documentation

## 2016-10-15 DIAGNOSIS — Z91040 Latex allergy status: Secondary | ICD-10-CM | POA: Insufficient documentation

## 2016-10-15 DIAGNOSIS — J45909 Unspecified asthma, uncomplicated: Secondary | ICD-10-CM | POA: Insufficient documentation

## 2016-10-15 DIAGNOSIS — I10 Essential (primary) hypertension: Secondary | ICD-10-CM | POA: Insufficient documentation

## 2016-10-15 DIAGNOSIS — Z79899 Other long term (current) drug therapy: Secondary | ICD-10-CM | POA: Insufficient documentation

## 2016-10-15 LAB — I-STAT BETA HCG BLOOD, ED (MC, WL, AP ONLY): I-stat hCG, quantitative: <5 m[IU]/mL (ref <5)

## 2016-10-15 LAB — COMPREHENSIVE METABOLIC PANEL
ALT: 13 U/L — ABNORMAL LOW (ref 14–54)
AST: 20 U/L (ref 15–41)
Albumin: 3.4 g/dL — ABNORMAL LOW (ref 3.5–5.0)
Alkaline Phosphatase: 66 U/L (ref 38–126)
Anion gap: 8 (ref 5–15)
BILIRUBIN TOTAL: 0.7 mg/dL (ref 0.3–1.2)
BUN: 11 mg/dL (ref 6–20)
CHLORIDE: 109 mmol/L (ref 101–111)
CO2: 20 mmol/L — ABNORMAL LOW (ref 22–32)
CREATININE: 0.77 mg/dL (ref 0.44–1.00)
Calcium: 8.3 mg/dL — ABNORMAL LOW (ref 8.9–10.3)
Glucose, Bld: 88 mg/dL (ref 65–99)
POTASSIUM: 4.7 mmol/L (ref 3.5–5.1)
Sodium: 137 mmol/L (ref 135–145)
TOTAL PROTEIN: 5.6 g/dL — AB (ref 6.5–8.1)

## 2016-10-15 LAB — CBC WITH DIFFERENTIAL/PLATELET
BASOS ABS: 0 10*3/uL (ref 0.0–0.1)
Basophils Relative: 0 %
EOS ABS: 0.2 10*3/uL (ref 0.0–0.7)
EOS PCT: 1 %
HCT: 37.1 % (ref 36.0–46.0)
Hemoglobin: 12.5 g/dL (ref 12.0–15.0)
Lymphocytes Relative: 34 %
Lymphs Abs: 3.6 10*3/uL (ref 0.7–4.0)
MCH: 26.8 pg (ref 26.0–34.0)
MCHC: 33.7 g/dL (ref 30.0–36.0)
MCV: 79.6 fL (ref 78.0–100.0)
Monocytes Absolute: 0.9 10*3/uL (ref 0.1–1.0)
Monocytes Relative: 8 %
Neutro Abs: 5.8 10*3/uL (ref 1.7–7.7)
Neutrophils Relative %: 57 %
PLATELETS: 202 10*3/uL (ref 150–400)
RBC: 4.66 MIL/uL (ref 3.87–5.11)
RDW: 13.8 % (ref 11.5–15.5)
WBC: 10.4 10*3/uL (ref 4.0–10.5)

## 2016-10-15 LAB — MAGNESIUM: MAGNESIUM: 1.7 mg/dL (ref 1.7–2.4)

## 2016-10-15 LAB — CBG MONITORING, ED: Glucose-Capillary: 86 mg/dL (ref 65–99)

## 2016-10-15 MED ORDER — SODIUM CHLORIDE 0.9 % IV SOLN
1000.0000 mg | Freq: Once | INTRAVENOUS | Status: AC
  Administered 2016-10-15: 1000 mg via INTRAVENOUS
  Filled 2016-10-15: qty 10

## 2016-10-15 MED ORDER — LEVETIRACETAM 1000 MG PO TABS: 1000.0000 mg | ORAL_TABLET | Freq: Two times a day (BID) | ORAL | 0 refills | Status: DC

## 2016-10-15 NOTE — ED Notes (Signed)
ED Provider at bedside. 

## 2016-10-15 NOTE — ED Provider Notes (Signed)
[facility]-EMERGENCY DEPT Provider Note   CSN: [id number] Arrival date & time: 10/15/16  1710     History   Chief Complaint Chief Complaint  Patient presents with  . Seizures    HPI Denise Bennett is a 41 y.o. female.  The history is provided by the patient.  Seizures   This is a new problem. The current episode started 1 to 2 hours ago. The problem has been resolved. There were 2 to 3 seizures. The most recent episode lasted more than 5 minutes. Associated symptoms include cough and diarrhea. Pertinent negatives include no confusion, no headaches, no neck stiffness and no vomiting. Characteristics include rhythmic jerking and loss of consciousness. Characteristics do not include bit tongue. The episode was not witnessed. There was no sensation of an aura present. The seizures did not continue in the ED. The seizure(s) had no focality. Possible causes do not include medication or dosage change, sleep deprivation, missed seizure meds, recent illness or change in alcohol use. There has been no fever. There were no medications administered prior to arrival.   41 year old female who presents a seizure. She has a history of bipolar disorder, seizure disorder on Keppra followed by our neurology. She does not recall what happened, but her significant other states that she had seizures today just prior to arrival. She reports that she has been compliant with medications. She's not had recent medication changes. Her husband was called to the Bactrim when she was lying down by her son, and she is just generalized convulsions. This is called, and it wasn't until about under 30 minutes before she got out of her seizures. She was confused, but now at baseline. Reports chronic right sided facial, upper and lower extremities numbness/weakness for over 7 years since diagnosis of seizure. This is unchanged.   Denies any recent illnesses including fevers, vomiting, difficulty breathing, new focal numbness or weakness.  Has had mild cough, congestion with diarrhea recently.     Past Medical History:  Diagnosis Date  . Bipolar 1 disorder (HCC)    Manic - depressive.  . 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 Hypertension   . Seizures (HCC)    Reportedly "deep brain seizures " - has some right-sided weakness (decreased sensation of facial nerve) over the seizures with some intermittent Bell's palsy symptoms.    Patient Active Problem List   Diagnosis Date Noted  . Asthma 06/03/2016  . Chest pain with moderate risk for cardiac etiology 12/09/2015  . Palpitations 12/09/2015  . Costochondritis 12/04/2015  . Seizures (HCC) 11/06/2015  . Left bundle branch block (LBBB) on electrocardiogram 11/06/2015    Past Surgical History:  Procedure Laterality Date  . CESAREAN SECTION  10/31/1992, 03/02/1996.  Marland Kitchen KNEE SURGERY Left 05/08/2013    OB History    No data available       Home Medications    Prior to Admission medications   Medication Sig Start Date End Date Taking? Authorizing Provider  acetaminophen (TYLENOL) 500 MG tablet Take 500-1,000 mg by mouth every 6 (six) hours as needed for headache.    [provider]  albuterol (PROAIR HFA) 108 (90 Base) MCG/ACT inhaler Inhale 1-2 puffs into the lungs every 6 (six) hours as needed for wheezing or shortness of breath. 09/18/16   Jaclyn Shaggy, MD  albuterol (PROVENTIL HFA;VENTOLIN HFA) 108 (90 Base) MCG/ACT inhaler Inhale 1-2 puffs into the lungs every 6 (six) hours as needed for wheezing or shortness of breath. 09/18/16  Jaclyn Shaggy, MD  diphenhydrAMINE (BENADRYL) 25 mg capsule Take 25 mg by mouth every 6 (six) hours as needed for itching or allergies.    [provider]  levETIRAcetam (KEPPRA) 1000 MG tablet Take 1 tablet (1,000 mg total) by mouth 2 (two) times daily. 09/18/16   Jaclyn Shaggy, MD  meloxicam (MOBIC) 7.5 MG tablet Take 1 tablet (7.5 mg total) by mouth daily. 09/18/16   Jaclyn Shaggy, MD     Family History Family History  Problem Relation Age of Onset  . Heart attack Mother   . Heart attack Father   . Healthy Sister   . Psychiatric Illness Brother        Multiple personality disorder  . Healthy Brother   . Healthy Brother   . Healthy Sister   . Healthy Sister   . Healthy Sister   . Diabetes Mellitus I Son   . Bone cancer Paternal Grandmother   . Lung cancer Paternal Grandfather     Social History Social History  Substance Use Topics  . Smoking status: Current Every Day Smoker    Packs/day: 0.25    Years: 15.00    Types: Cigarettes  . Smokeless tobacco: Never Used  . Alcohol use No     Allergies   Cinnamon; Penicillins; Latex; Other; and Tape   Review of Systems Review of Systems  Respiratory: Positive for cough.   Gastrointestinal: Positive for diarrhea. Negative for vomiting.  Neurological: Positive for seizures and loss of consciousness. Negative for headaches.  Psychiatric/Behavioral: Negative for confusion.  All other systems reviewed and are negative.    Physical Exam Updated Vital Signs BP (!) 157/85 (BP Location: Left Arm)   Pulse 64   Temp 98.1 F (36.7 C) (Oral)   Resp 18   SpO2 99%   Physical Exam Physical Exam  Nursing note and vitals reviewed. Constitutional: Well developed, well nourished, non-toxic, and in no acute distress Head: Normocephalic and atraumatic.  Mouth/Throat: Oropharynx is clear and moist.  Neck: Normal range of motion. Neck supple.  Cardiovascular: Normal rate and regular rhythm.   Pulmonary/Chest: Effort normal and breath sounds normal.  Abdominal: Soft. There is no tenderness. There is no rebound and no guarding.  Musculoskeletal: Normal range of motion.  Neurological: Alert, mild right facial droop at the lip with smiling (chronic), fluent speech, no pronator drift, EOMI, diminished right sided sensation to touch (reported chronic/baseline), no pronator drift, slight weakness of RLE against gravity  (baseline) Skin: Skin is warm and dry.  Psychiatric: Cooperative   ED Treatments / Results  Labs (all labs ordered are listed, but only abnormal results are displayed) Labs Reviewed  COMPREHENSIVE METABOLIC PANEL - Abnormal; Notable for the following:       Result Value   CO2 20 (*)    Calcium 8.3 (*)    Total Protein 5.6 (*)    Albumin 3.4 (*)    ALT 13 (*)    All other components within normal limits  CBC WITH DIFFERENTIAL/PLATELET  MAGNESIUM  CBG MONITORING, ED  I-STAT BETA HCG BLOOD, ED ([facility], [facility], [facility] ONLY)    EKG  EKG Interpretation  Date/Time:  Thursday October 15 2016 17:47:09 EDT Ventricular Rate:  56 PR Interval:  188 QRS Duration: 152 QT Interval:  466 QTC Calculation: 449 R Axis:   21 Text Interpretation:  Sinus bradycardia Left bundle branch block Abnormal ECG new LBBB Confirmed by Crista Curb [phone]) on 10/15/2016 5:52:29 PM       Radiology No results  found.  Procedures Procedures (including critical care time)  Medications Ordered in ED Medications  levETIRAcetam (KEPPRA) 1,000 mg in sodium chloride 0.9 % 100 mL IVPB (1,000 mg Intravenous New Bag/Given 10/15/16 1822)     Initial Impression / Assessment and Plan / ED Course  I have reviewed the triage vital signs and the nursing notes.  Pertinent labs & imaging results that were available during my care of the patient were reviewed by me and considered in my medical decision making (see chart for details).     Presenting with breakthrough seizures. Vital signs within normal limits. She is well-appearing in no acute distress. She reports her 7 years of right-sided numbness and weakness, that is unchanged. The remainder of her neurological exam is intact.  She has no major tried her metabolic derangements. EKG does show evidence of any left bundle branch block. She denies any cardiopulmonary symptoms. This episode does seem more consistent with seizures rather than syncope. She reports that her primary  care doctor has noticed this recently and referred her to see Dr. Herbie Baltimore from cardiology later this week.  Diagnosis Keppra here in the emergency department. Remains seizure free. She will call her neurologist for close follow-up. Strict return and follow-up instructions reviewed. Sheexpressed understanding of all discharge instructions and felt comfortable with the plan of care.   Final Clinical Impressions(s) / ED Diagnoses   Final diagnoses:  Seizure [facility])    New Prescriptions New Prescriptions   No medications on file     Lavera Guise, MD 10/15/16 [phone]

## 2016-10-15 NOTE — ED Triage Notes (Signed)
Per [facility] patient comes in for seizure.  Patient had Bell's Palsy in 2009 which started seizures.  Patient has some facial droop which gets worse during seizure activity per [facility].  Patient given NS via IV in left hand en route.

## 2016-10-15 NOTE — Discharge Instructions (Signed)
Please call your neurologist for follow-up.   Please return for worsening symptoms, including recurrent seizures with persistent confusion, seizures greater than 5 minutes, fevers, or any other symptoms concerning to you.

## 2016-10-16 MED FILL — levETIRAcetam 1000 MG TABS: 1000 | 30 days supply | Qty: 60 | Fill #0

## 2016-10-26 MED FILL — MELOXICAM 7.5 MG TABLET: 7.5 | 30 days supply | Qty: 30 | Fill #1

## 2016-11-02 ENCOUNTER — Ambulatory Visit (INDEPENDENT_AMBULATORY_CARE_PROVIDER_SITE_OTHER): Payer: No Typology Code available for payment source | Admitting: Cardiology

## 2016-11-02 ENCOUNTER — Encounter: Payer: Self-pay | Admitting: Cardiology

## 2016-11-02 VITALS — BP 152/74 | HR 63 | Ht 64.5 in | Wt 195.0 lb

## 2016-11-02 DIAGNOSIS — R0789 Other chest pain: Secondary | ICD-10-CM

## 2016-11-02 DIAGNOSIS — I447 Left bundle-branch block, unspecified: Secondary | ICD-10-CM

## 2016-11-02 DIAGNOSIS — R002 Palpitations: Secondary | ICD-10-CM

## 2016-11-02 NOTE — Progress Notes (Signed)
PCP: Jaclyn Shaggy, MD  Clinic Note: Chief Complaint  Patient presents with  . Chest Pain    pt states tight and feels like pressure almost something like a gas bubble  . Follow-up    HPI: Denise Bennett is a 41 y.o. female with a PMH below who presents today for Annual follow-up or chest discomfort. She is a history of seizure disorder (deep breathing seizures)  Denise Bennett was last seen 12/09/2015 - she was seen for chest discomfort and left bundle-branch block. plan was for her to have a Myoview Stress Test but this was never done. She was also supposed to wear an event monitor, but she never did.  Nevere heard about scheduling ST & could not afford Monitor. --> Essentially, the stress test cannot be scheduled, because shortly after the order was placed last fall, she ended up homeless with her boyfriend and child, and did not have a new address to send correspondence to or telephone number. --> She says that now she isn't in a better place. They have their own home now and the situation is improved.  Recent Hospitalizations:   ER March 2018 for precordial chest pain - thought to be costochondritis  ER September 20 for possible seizure  Studies Personally Reviewed - (if available, images/films reviewed: From Epic Chart or Care Everywhere)  n/a  Interval History: [name] returns today again complaining of persistent chest discomfort is mostly on the right upper chest. She also notes episodes of rapid irregular heartbeats palpitations. She notes that she's been having this chest discomfort for the last 3-4 months. She says especially sore all the time. Is persistent and is not necessarily made worse with any particular activity besides deep inspiration. She usually doesn't notice it when she is lying still, but essentially she gets up and moving around, she notices that the symptoms worse. She denies any other exertional chest discomfort or dyspnea.  The chest discomfort is anywhere  from a 2-8/10. It is right side of the chest radiating to the shoulder. Overall although there've been some good days and bad days, this symptom has been pretty much persistent.  As the irregular heartbeat/palpitations see describes short-lived spells of fast irregular heartbeats that may occur about 3 or 4 times a week. She does note a little dizziness associated with these irregular heartbeats, but no syncope or near syncope. She has not had any heart failure symptoms of PND, orthopnea or edema.  No melena, hematochezia, hematuria, or epstaxis. No claudication.  ROS: A comprehensive was performed. Review of Systems  Constitutional: Positive for malaise/fatigue.  HENT: Negative for congestion.   Respiratory: Positive for cough (Quite often in the morning, usually at night. Worse when her asthma acts up.), shortness of breath and wheezing ("ashtma").   Cardiovascular:       Per HPI  Gastrointestinal: Positive for abdominal pain. Negative for blood in stool and melena.  Genitourinary: Negative for hematuria.  Musculoskeletal: Positive for joint pain (L knee).  Neurological: Positive for dizziness and seizures.  Psychiatric/Behavioral: Negative for depression and memory loss. The patient is nervous/anxious. The patient does not have insomnia.   All other systems reviewed and are negative.   I have reviewed and (if needed) personally updated the patient's problem list, medications, allergies, past medical and surgical history, social and family history.   Past Medical History:  Diagnosis Date  . Bipolar 1 disorder (HCC)    Manic - depressive.  . 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 Hypertension   . Seizures (HCC)    Reportedly "deep brain seizures " - has some right-sided weakness (decreased sensation of facial nerve) over the seizures with some intermittent Bell's palsy symptoms.    Past Surgical History:  Procedure Laterality Date  . CESAREAN SECTION   10/31/1992, 03/02/1996.  Marland Kitchen KNEE SURGERY Left 05/08/2013    Current Meds  Medication Sig  . acetaminophen (TYLENOL) 500 MG tablet Take 500-1,000 mg by mouth every 6 (six) hours as needed for headache.  . albuterol (PROVENTIL HFA;VENTOLIN HFA) 108 (90 Base) MCG/ACT inhaler Inhale 1-2 puffs into the lungs every 6 (six) hours as needed for wheezing or shortness of breath.  Marland Kitchen aspirin 81 MG tablet Take 81 mg by mouth daily.  . diphenhydrAMINE (BENADRYL) 25 mg capsule Take 25 mg by mouth every 6 (six) hours as needed for itching or allergies.  Marland Kitchen levETIRAcetam (KEPPRA) 1000 MG tablet Take 1 tablet (1,000 mg total) by mouth 2 (two) times daily.  . meloxicam (MOBIC) 7.5 MG tablet Take 1 tablet (7.5 mg total) by mouth daily.    Allergies  Allergen Reactions  . Cinnamon Anaphylaxis  . Penicillins Anaphylaxis and Other (See Comments)    Has patient had a PCN reaction causing immediate rash, facial/tongue/throat swelling, SOB or lightheadedness with hypotension: Yes Has patient had a PCN reaction causing severe rash involving mucus membranes or skin necrosis: Unknown Has patient had a PCN reaction that required hospitalization: Yes Has patient had a PCN reaction occurring within the last 10 years: No If all of the above answers are "NO", then may proceed with Cephalosporin use. "Ended up in an O2 tent"   . Latex Rash    RASH TURNS INTO WELTS  . Other Itching and Rash    GREEN (BELL) PEPPERS   . Tape Rash    RASH TURNS INTO WELTS    Social History   Social History  . Marital status: Married    Spouse name: N/A  . Number of children: 2  . Years of education: GED   Occupational History  . Disabled     Secondary to seizures   Social History Main Topics  . Smoking status: Current Every Day Smoker    Packs/day: 0.25    Years: 15.00    Types: Cigarettes  . Smokeless tobacco: Never Used  . Alcohol use No  . Drug use: No  . Sexual activity: Yes    Partners: Male   Other Topics  Concern  . None   Social History Narrative   She is married mother of 2 with one stepson. Her first child was from a different father. She lives with her husband and son. They have recently moved from [place] to [facility].   She has been smoking for about 15 years.   She walks quite a bit several days a week at least 1-1/2-2 hours at a slow pace.    family history includes Bone cancer in her paternal grandmother; Diabetes Mellitus I in her son; Healthy in her brother, brother, sister, sister, sister, and sister; Heart attack in her father and mother; Lung cancer in her paternal grandfather; Psychiatric Illness in her brother.  Wt Readings from Last 3 Encounters:  11/02/16 195 lb (88.5 kg)  09/18/16 199 lb (90.3 kg)  06/03/16 202 lb (91.6 kg)    PHYSICAL EXAM BP (!) 152/74   Pulse 63   Ht 5' 4.5" (1.638 m)   Wt 195 lb (88.5 kg)   BMI  32.95 kg/m  Physical Exam  Constitutional: She appears well-developed and well-nourished. No distress.  Anxious. Well-groomed. She does still smoke cigarettes and PET dander. Quite anxious.  Eyes: EOM are normal.  Neck: No hepatojugular reflux and no JVD present. Carotid bruit is not present.  Cardiovascular: Normal rate, regular rhythm, normal heart sounds and intact distal pulses.   No extrasystoles are present. PMI is not displaced.  Exam reveals no gallop and no friction rub.   No murmur heard. Pulmonary/Chest: Effort normal and breath sounds normal. No respiratory distress. She has no wheezes. She has no rales. She exhibits tenderness (Tenderness to palpation along the right chest from mid clavicular line to the axilla.).  Abdominal: Soft. Bowel sounds are normal. She exhibits no distension. There is no tenderness. There is no rebound.  Skin: Skin is warm and dry. No rash noted. No erythema.  Psychiatric: She has a normal mood and affect. Judgment and thought content normal.  Nursing note and vitals reviewed.   Adult ECG Report   Rate: 63 ;  Rhythm: normal sinus rhythm and LBBB. Otherwise normal axis, durations and intervals.;   Narrative Interpretation: Stable EKG   Other studies Reviewed: Additional studies/ records that were reviewed today include:  Recent Labs:  No results found for: CHOL, HDL, LDLCALC, LDLDIRECT, TRIG, CHOLHDL   ASSESSMENT / PLAN: Her chest pain is mostly chest wall pain on the right side of the chest. It does not seem cardiac in all - very atypical features and that is persistent, right upper chest and reproducible on exam. I don't think we need do anything for stress test for this. She does note palpitations and appeared to have a near syncopal event to her being evaluated. Plan: Cardiac Event Monitor along with cardiac event monitor to evaluate palpitations.  Unfortunately, going for these tests has raised the possibility of financial issues again. We'll wait to see if studies can be done.  Problem List Items Addressed This Visit    Chest wall pain (Chronic)   Relevant Orders   CARDIAC EVENT MONITOR   CT CARDIAC SCORING   Left bundle branch block (LBBB) on electrocardiogram (Chronic)   Relevant Orders   EKG 12-Lead (Completed)   CARDIAC EVENT MONITOR   CT CARDIAC SCORING   Palpitations - Primary (Chronic)   Relevant Orders   EKG 12-Lead (Completed)   CARDIAC EVENT MONITOR   CT CARDIAC SCORING      Current medicines are reviewed at length with the patient today. (+/- concerns) n/a The following changes have been made: n/a  Patient Instructions  SCHEDULE AT [address] Your physician has requested that you have  CT CALCIUM SCORING. Cardiac computed tomography (CT) is a painless test that uses an x-ray machine to take clear, detailed pictures of your heart. For further information please visit [email or web address] Please follow instruction sheet as given. AND Your physician has recommended that you wear an event monitor 14 DAY . Event monitors are medical  devices that record the heart's electrical activity. Doctors most often [place] these monitors to diagnose arrhythmias. Arrhythmias are problems with the speed or rhythm of the heartbeat. The monitor is a small, portable device. You can wear one while you do your normal daily activities. This is usually used to diagnose what is causing palpitations/syncope (passing out).    Your physician recommends that you schedule a follow-up appointment in 2 MONTHS WITH DR Damain Broadus.     Studies Ordered:   Orders Placed This Encounter  Procedures  . CT CARDIAC SCORING  . CARDIAC EVENT MONITOR  . EKG 12-Lead      Bryan Lemma, M.D., M.S. Interventional Cardiologist   Pager # [phone] Phone # [phone] [address] [place], [place] [address]

## 2016-11-02 NOTE — Patient Instructions (Signed)
SCHEDULE AT [address] Your physician has requested that you have  CT CALCIUM SCORING. Cardiac computed tomography (CT) is a painless test that uses an x-ray machine to take clear, detailed pictures of your heart. For further information please visit [email or web address] Please follow instruction sheet as given. AND Your physician has recommended that you wear an event monitor 14 DAY . Event monitors are medical devices that record the heart's electrical activity. Doctors most often [place] these monitors to diagnose arrhythmias. Arrhythmias are problems with the speed or rhythm of the heartbeat. The monitor is a small, portable device. You can wear one while you do your normal daily activities. This is usually used to diagnose what is causing palpitations/syncope (passing out).    Your physician recommends that you schedule a follow-up appointment in 2 MONTHS WITH DR HARDING.

## 2016-11-04 ENCOUNTER — Telehealth: Payer: Self-pay | Admitting: Family Medicine

## 2016-11-04 ENCOUNTER — Encounter: Payer: Self-pay | Admitting: Cardiology

## 2016-11-04 NOTE — Telephone Encounter (Signed)
Pt called requesting to speak to PCP to check if it would be okay to take meloxicam (MOBIC) 7.5 MG tablet Now and later at night. Please f/up

## 2016-11-04 NOTE — Telephone Encounter (Signed)
Meloxicam should be taken once daily

## 2016-11-04 NOTE — Telephone Encounter (Signed)
Will route to PCP 

## 2016-11-05 NOTE — Telephone Encounter (Signed)
Pt was called and informed that medication is to be taken once a day.

## 2016-11-16 MED FILL — levETIRAcetam 1000 MG TABS: 1000 | 30 days supply | Qty: 60 | Fill #1

## 2016-12-07 ENCOUNTER — Emergency Department (HOSPITAL_COMMUNITY)
Admission: EM | Admit: 2016-12-07 | Discharge: 2016-12-08 | Disposition: A | Discharge status: Home / Self Care | Payer: Self-pay | Attending: Emergency Medicine | Admitting: Emergency Medicine

## 2016-12-07 ENCOUNTER — Emergency Department (HOSPITAL_COMMUNITY): Payer: Self-pay

## 2016-12-07 ENCOUNTER — Encounter (HOSPITAL_COMMUNITY): Payer: Self-pay | Admitting: Family Medicine

## 2016-12-07 DIAGNOSIS — F17210 Nicotine dependence, cigarettes, uncomplicated: Secondary | ICD-10-CM | POA: Insufficient documentation

## 2016-12-07 DIAGNOSIS — R569 Unspecified convulsions: Secondary | ICD-10-CM | POA: Insufficient documentation

## 2016-12-07 DIAGNOSIS — R0789 Other chest pain: Secondary | ICD-10-CM | POA: Insufficient documentation

## 2016-12-07 DIAGNOSIS — J45909 Unspecified asthma, uncomplicated: Secondary | ICD-10-CM | POA: Insufficient documentation

## 2016-12-07 DIAGNOSIS — Z91040 Latex allergy status: Secondary | ICD-10-CM | POA: Insufficient documentation

## 2016-12-07 DIAGNOSIS — M25551 Pain in right hip: Secondary | ICD-10-CM | POA: Insufficient documentation

## 2016-12-07 DIAGNOSIS — I10 Essential (primary) hypertension: Secondary | ICD-10-CM | POA: Insufficient documentation

## 2016-12-07 HISTORY — DX: Concussion with loss of consciousness of unspecified duration, initial encounter: S06.0X9A

## 2016-12-07 HISTORY — DX: Concussion with loss of consciousness status unknown, initial encounter: S06.0XAA

## 2016-12-07 LAB — BASIC METABOLIC PANEL
Anion gap: 8 (ref 5–15)
BUN: 10 mg/dL (ref 6–20)
CHLORIDE: 107 mmol/L (ref 101–111)
CO2: 21 mmol/L — ABNORMAL LOW (ref 22–32)
Calcium: 8.7 mg/dL — ABNORMAL LOW (ref 8.9–10.3)
Creatinine, Ser: 0.7 mg/dL (ref 0.44–1.00)
GFR calc Af Amer: >60 mL/min (ref >60)
GFR calc non Af Amer: >60 mL/min (ref >60)
Glucose, Bld: 102 mg/dL — ABNORMAL HIGH (ref 65–99)
POTASSIUM: 3.8 mmol/L (ref 3.5–5.1)
SODIUM: 136 mmol/L (ref 135–145)

## 2016-12-07 LAB — I-STAT BETA HCG BLOOD, ED (MC, WL, AP ONLY): I-stat hCG, quantitative: <5 m[IU]/mL (ref <5)

## 2016-12-07 NOTE — ED Notes (Signed)
Patient ambulated to radiology.

## 2016-12-07 NOTE — ED Notes (Signed)
Patient ambulated to restroom and back to stretcher with steady gait. Patient tolerated it well.

## 2016-12-07 NOTE — Discharge Instructions (Signed)

## 2016-12-07 NOTE — ED Triage Notes (Signed)
[facility] is from home [facility] 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 When EMS arrived patient was alert, oriented x 4, and no postictal symptoms. Patient was found on the floor but didn't fall. Patient was complaining of left hip pain.

## 2016-12-07 NOTE — ED Provider Notes (Signed)
Emergency Department Provider Note   I have reviewed the triage vital signs and the nursing notes.   HISTORY  Chief Complaint Seizures   HPI Denise Bennett is a 41 y.o. female with PMH of Bipolar disorder, seizure disorder, and pseudoseizures presents to the emergency department for evaluation of breakthrough seizure today.  She was eating dinner shortly prior to ED presentation when she had 3 brief seizures.  The patient's significant other at bedside states that each seizure was 30 seconds to a minute.  The triage note indicates a 30-minute seizure but this is incorrect.  Patient's last seizures were in late September.  Patient does not recall events surrounding her seizure. She has followed with neurology [place] an [place] in [place] but has not seen a neurologist locally as of yet.  Patient reports she has a appointment scheduled for December 5.  She has baseline right arm and leg weakness.  She denies this is worse than normal.  No pain in the mouth or urine incontinence.  Patient states that she did not want to come to the emergency department but was encouraged to do so by EMS. States that she has been compliant with her Keppra 1000 BID, has been sleeping, staying hydrated, and not drinking EtOH or using drugs.    Past Medical History:  Diagnosis Date  . Bipolar 1 disorder (HCC)    Manic - depressive.  . Concussion   . 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 Hypertension   . Seizures (HCC)    Reportedly "deep brain seizures " - has some right-sided weakness (decreased sensation of facial nerve) over the seizures with some intermittent Bell's palsy symptoms.    Patient Active Problem List   Diagnosis Date Noted  . Asthma 06/03/2016  . Chest wall pain 12/09/2015  . Palpitations 12/09/2015  . Costochondritis 12/04/2015  . Seizures (HCC) 11/06/2015  . Left bundle branch block (LBBB) on electrocardiogram 11/06/2015    Past Surgical History:  Procedure  Laterality Date  . CESAREAN SECTION  10/31/1992, 03/02/1996.  Marland Kitchen. KNEE SURGERY Left 05/08/2013    Current Outpatient Rx  . Order #: [id number] Class: Historical Med  . Order #: [id number] Class: Normal  . Order #: [id number] Class: Historical Med  . Order #: [id number] Class: Historical Med  . Order #: [id number] Class: Normal  . Order #: [id number] Class: Normal  . Order #: [id number] Class: Historical Med    Allergies Cinnamon; Penicillins; Latex; Other; and Tape  Family History  Problem Relation Age of Onset  . Heart attack Mother   . Heart attack Father   . Healthy Sister   . Psychiatric Illness Brother        Multiple personality disorder  . Healthy Brother   . Healthy Brother   . Healthy Sister   . Healthy Sister   . Healthy Sister   . Diabetes Mellitus I Son   . Bone cancer Paternal Grandmother   . Lung cancer Paternal Grandfather     Social History Social History   Tobacco Use  . Smoking status: Current Every Day Smoker    Packs/day: 0.25    Years: 15.00    Pack years: 3.75    Types: Cigarettes  . Smokeless tobacco: Never Used  Substance Use Topics  . Alcohol use: No  . Drug use: No    Review of Systems  Constitutional: No fever/chills Eyes: No visual changes. ENT: No sore throat. Cardiovascular: Denies chest pain. Respiratory: Denies shortness of breath. Gastrointestinal: No abdominal pain.  No nausea, no vomiting.  No diarrhea.  No constipation. Genitourinary: Negative for dysuria. Musculoskeletal: Negative for back pain. Skin: Negative for rash. Neurological: Negative for headaches, focal weakness or numbness. Positive seizure today.   10-point ROS otherwise negative.  ____________________________________________   PHYSICAL EXAM:  VITAL SIGNS: ED Triage Vitals  Enc Vitals Group     BP 12/07/16 2200 131/72     Pulse Rate 12/07/16 2200 66     Resp 12/07/16 2200 11     Temp 12/07/16 2200 97.9 F (36.6 C)     Temp Source 12/07/16 2200 Oral      SpO2 12/07/16 2140 97 %     Weight 12/07/16 2148 185 lb (83.9 kg)     Height 12/07/16 2148 5\' 4"  (1.626 m)     Pain Score 12/07/16 2147 5   Constitutional: Alert and oriented. Well appearing and in no acute distress. Eyes: Conjunctivae are normal. PERRL. EOMI. Head: Atraumatic. Nose: No congestion/rhinnorhea. Mouth/Throat: Mucous membranes are moist Neck: No stridor.  Cardiovascular: Normal rate, regular rhythm. Good peripheral circulation. Grossly normal heart sounds.   Respiratory: Normal respiratory effort.  No retractions. Lungs CTAB. Gastrointestinal: Soft and nontender. No distention.  Musculoskeletal: No lower extremity tenderness nor edema. No gross deformities of extremities. Neurologic:  Normal speech and language. 4+/5 weakness in the RUE and RLE (baseline). Normal strength on left. Normal sensation throughout. Normal CN exam 2-12.  Skin:  Skin is warm, dry and intact. No rash noted.  ____________________________________________   LABS (all labs ordered are listed, but only abnormal results are displayed)  Labs Reviewed  BASIC METABOLIC PANEL - Abnormal; Notable for the following components:      Result Value   CO2 21 (*)    Glucose, Bld 102 (*)    Calcium 8.7 (*)    All other components within normal limits  I-STAT BETA HCG BLOOD, ED ([facility], [facility], [facility] ONLY)   ____________________________________________  RADIOLOGY  Dg Ribs Unilateral W/chest Left  Result Date: 12/07/2016 CLINICAL DATA:  Seizure, hit ribs, now with pain EXAM: LEFT RIBS AND CHEST - 3+ VIEW COMPARISON:  04/02/2016 FINDINGS: Single-view chest demonstrates no acute consolidation or effusion. Normal heart size. No pneumothorax. Left rib series demonstrates no acute displaced left rib fracture IMPRESSION: Negative. Electronically Signed   By: Jasmine Pang M.D.   On: 12/07/2016 23:26   Dg Hip Unilat W Or Wo Pelvis 2-3 Views Right  Result Date: 12/07/2016 CLINICAL DATA:  41 year old female with right hip  pain. EXAM: DG HIP (WITH OR WITHOUT PELVIS) 2-3V RIGHT COMPARISON:  None. FINDINGS: There is no acute fracture or dislocation. There is apparent slight over coverage of the femoral head by the acetabula bilaterally concerning for femoroacetabular impingement. Clinical correlation is recommended. No significant arthritic changes. The soft tissues appear unremarkable. IMPRESSION: 1. No acute fracture or dislocation. 2. 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 Clinical correlation is recommended. Electronically Signed   By: Elgie Collard M.D.   On: 12/07/2016 23:23    ____________________________________________   PROCEDURES  Procedure(s) performed:   Procedures  None ____________________________________________   INITIAL IMPRESSION / ASSESSMENT AND PLAN / ED COURSE  Pertinent labs & imaging results that were available during my care of the patient were reviewed by me and considered in my medical decision making (see chart for details).  Patient presents to the emergency department for evaluation of breakthrough seizure.  He reports some left lateral chest pain and right hip pain after her seizures.  She is awake  and alert.  She has baseline weakness in her right upper and lower extremities.  She has been compliant with Keppra and has an appointment scheduled with her outpatient neurologist on Dec 5th.  No clear provoking factors for her breakthrough seizures. No indication for head CT or other neuroimaging at this time.   Imaging negative for acute process. Plan for Neurology follow up as an outpatient.   At this time, I do not feel there is any life-threatening condition present. I have reviewed and discussed all results (EKG, imaging, lab, urine as appropriate), exam findings with patient. I have reviewed nursing notes and appropriate previous records.  I feel the patient is safe to be discharged home without further emergent workup. Discussed usual and customary  return precautions. Patient and family (if present) verbalize understanding and are comfortable with this plan.  Patient will follow-up with their primary care provider. If they do not have a primary care provider, information for follow-up has been provided to them. All questions have been answered.  ____________________________________________  FINAL CLINICAL IMPRESSION(S) / ED DIAGNOSES  Final diagnoses:  Seizure-like activity (HCC)     MEDICATIONS GIVEN DURING THIS VISIT:  None  Note:  This document was prepared using Dragon voice recognition software and may include unintentional dictation errors.  Alona BeneJoshua Ligaya Cormier, MD Emergency Medicine   Versie Soave, Arlyss RepressJoshua G, MD 12/07/16 [id number]

## 2016-12-07 NOTE — ED Notes (Signed)
Bed: EA54WA05 Expected date:  Expected time:  Means of arrival:  Comments: seizures

## 2016-12-07 NOTE — ED Notes (Signed)
Patients family reports patient was standing up when she had started experiencing a seizure. Family reports her extremities were shaking and this last about 30 minutes the first time and about 2 seconds while he was on the phone with dispatch. Also, reports family members were able to catch the patient before she fell to the ground.

## 2016-12-15 MED FILL — MELOXICAM 7.5 MG TABLET: 7.5 | 30 days supply | Qty: 30 | Fill #2

## 2016-12-15 MED FILL — ?LEVETIRACETAM 1000 MG TAB: 1000 MG | 30 days supply | Qty: 60 | Fill #2

## 2016-12-21 ENCOUNTER — Ambulatory Visit: Payer: Self-pay | Admitting: Family Medicine

## 2016-12-30 ENCOUNTER — Ambulatory Visit (INDEPENDENT_AMBULATORY_CARE_PROVIDER_SITE_OTHER): Payer: Self-pay | Admitting: Neurology

## 2016-12-30 ENCOUNTER — Encounter: Payer: Self-pay | Admitting: Neurology

## 2016-12-30 VITALS — BP 130/72 | HR 80 | Ht 64.5 in | Wt 195.0 lb

## 2016-12-30 DIAGNOSIS — R569 Unspecified convulsions: Secondary | ICD-10-CM

## 2016-12-30 DIAGNOSIS — G40209 Localization-related (focal) (partial) symptomatic epilepsy and epileptic syndromes with complex partial seizures, not intractable, without status epilepticus: Secondary | ICD-10-CM

## 2016-12-30 MED ORDER — LEVETIRACETAM 1000 MG PO TABS: 1000.0000 mg | ORAL_TABLET | Freq: Two times a day (BID) | ORAL | 3 refills | Status: DC

## 2016-12-30 NOTE — Progress Notes (Signed)
NEUROLOGY CONSULTATION NOTE  Denise Bennett MRN: [id number] DOB: December 02, 1975  Referring provider: Dr. Jaclyn ShaggyEnobong Amao Primary care provider: Dr. Jaclyn ShaggyEnobong Amao  Reason for consult:  seizures  Dear Dr Denise NightAmao:  Thank you for your kind referral of Denise Bennett for consultation of the above symptoms. Although her history is well known to you, please allow me to reiterate it for the purpose of our medical record. The patient was accompanied to the clinic by her husband who also provides collateral information. Records and images were personally reviewed where available.  HISTORY OF PRESENT ILLNESS: This is a 41 year old right-handed woman with a history of bipolar disorder and seizures, presenting to establish care. She moved to [place] last year and was seeing a neurologist in [place], records unavailable for review. She and her husband report the first seizure occurred in March 2009, she was unaware of what occurred, stating it was "freaking my husband out and I did not know what he was talking about." According to her husband, she reported right-sided numbness then had right facial drooping. Her speech was slurred and she was "baby talking." Her right hand was clenched then she started shaking. 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 She was having these episodes 1-2 times a week. Her husband reports the episodes were the same all the time. [place] moved to [place], [place] and saw a different neurologist who told her she had "deep brain seizures" making her "Bell's palsy to act out." She recalls having an MRI in 2016 reported as normal. She has had several EEGs in different states, they recall having an EEG [facility] in 2010 and were told her "brain was not connecting." She has been taking Keppra 1000mg  BID for the past 3 years. She continues to have seizures where she feels funny, tastes copper, then looks down to see her right hand clenched  into a fist and the toes on her right foot flexed down. She would feel numb on the right face/arm/leg. It takes  Several hours for the feeling to come back after a seizure. She has injured herself with the seizures, reporting bruises "all the time," she has bit the inside of her cheek and has had urinary incontinence with the seizures. Over the past year, she has been to the ER 3 times for seizures. In 10/2015, she reported running out of Keppra for a week and had a convulsion. In 09/2016, she had 2-3 seizures in one day, discharged home on same dose Keppra. Her last ER visit on 12/07/16, they reported 3 brief seizures. Her husband reports this was different, she was standing in the kitchen eating, then started staring off. Her son took her plate away then she fell to the floor shaking. He reports 3 seizures in a 40-minute period. Her husband reports one seizure in her sleep a year ago. She reports that since the seizures started, her body stays swollen and sore. She has chronic anterior right-sided chest pain diagnosed as costochondritis/"inflammation in chest wall cavity." They recall taking Depakote in the past, which did not help with seizures. She may have taken clonazepam ("unrecalled medication dissolved under the tongue that tasted nasty"). She has a history of bipolar disorder, stating she was diagnosed with a chemical imbalance in her brain when younger. She is not taking any medication for this. Her husband notes staring spells every other day where she does not respond and he has to tap  her on the cheek. She denies any rising epigastric sensation or myoclonic jerks.   She has had headaches on and off since 2009, usually over the right temporal region, radiating to the back. She feels like there is a knife drilling the back of her head, lasting for hours. She takes Tylenol prn, denies taking it daily. She has a history of migraines in the past with photophobia and nausea/vomiting, none in the past year.  She reports a lot of visual obscurations even without migraines. She has occasional dizziness when ambulating. She denies any dysarthria/dysphagia, currently no focal numbness/tingling/weakness. Her chest pain and sore arms bother her the most. She has back pain and some neck tenderness. She has noticed more difficulty sleeping since taking the Keppra. Her husband notes mood changes, like a "flip switch," but states there is a lot of stress at home taking care of young grandchildren. She does not drive.   Epilepsy Risk Factors:  Her maternal uncle had epilepsy in childhood. Otherwise she had a normal birth and early development.  There is no history of febrile convulsions, CNS infections such as meningitis/encephalitis, significant traumatic brain injury, neurosurgical procedures.  Prior AEDs: Depakote, ?clonazepam Laboratory Data:  Lab Results  Component Value Date   WBC 10.4 10/15/2016   HGB 12.5 10/15/2016   HCT 37.1 10/15/2016   MCV 79.6 10/15/2016   PLT 202 10/15/2016     Chemistry      Component Value Date/Time   NA 136 12/07/2016 2212   NA 140 09/18/2016 1514   K 3.8 12/07/2016 2212   CL 107 12/07/2016 2212   CO2 21 (L) 12/07/2016 2212   BUN 10 12/07/2016 2212   BUN 7 09/18/2016 1514   CREATININE 0.70 12/07/2016 2212      Component Value Date/Time   CALCIUM 8.7 (L) 12/07/2016 2212   ALKPHOS 66 10/15/2016 1739   AST 20 10/15/2016 1739   ALT 13 (L) 10/15/2016 1739   BILITOT 0.7 10/15/2016 1739      PAST MEDICAL HISTORY: Past Medical History:  Diagnosis Date  . Bipolar 1 disorder (HCC)    Manic - depressive.  . Concussion   . 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 Hypertension   . Seizures (HCC)    Reportedly "deep brain seizures " - has some right-sided weakness (decreased sensation of facial nerve) over the seizures with some intermittent Bell's palsy symptoms.    PAST SURGICAL HISTORY: Past Surgical History:  Procedure Laterality  Date  . CESAREAN SECTION  10/31/1992, 03/02/1996.  Marland Kitchen. KNEE SURGERY Left 05/08/2013    MEDICATIONS: Current Outpatient Medications on File Prior to Visit  Medication Sig Dispense Refill  . acetaminophen (TYLENOL) 500 MG tablet Take 500-1,000 mg by mouth every 6 (six) hours as needed for headache.    . albuterol (PROVENTIL HFA;VENTOLIN HFA) 108 (90 Base) MCG/ACT inhaler Inhale 1-2 puffs into the lungs every 6 (six) hours as needed for wheezing or shortness of breath. 54 g 3  . aspirin 81 MG tablet Take 81 mg by mouth daily.    . diphenhydrAMINE (BENADRYL) 25 mg capsule Take 25 mg by mouth every 6 (six) hours as needed for itching or allergies.    Marland Kitchen. levETIRAcetam (KEPPRA) 1000 MG tablet Take 1 tablet (1,000 mg total) by mouth 2 (two) times daily. 60 tablet 3  . meloxicam (MOBIC) 7.5 MG tablet Take 1 tablet (7.5 mg total) by mouth daily. 30 tablet 3  . Multiple Vitamin (MULTIVITAMIN WITH MINERALS)  TABS tablet Take 1 tablet daily by mouth.     No current facility-administered medications on file prior to visit.     ALLERGIES: Allergies  Allergen Reactions  . Cinnamon Anaphylaxis  . Penicillins Anaphylaxis and Other (See Comments)    Has patient had a PCN reaction causing immediate rash, facial/tongue/throat swelling, SOB or lightheadedness with hypotension: Yes Has patient had a PCN reaction causing severe rash involving mucus membranes or skin necrosis: Unknown Has patient had a PCN reaction that required hospitalization: Yes Has patient had a PCN reaction occurring within the last 10 years: No If all of the above answers are "NO", then may proceed with Cephalosporin use. "Ended up in an O2 tent"   . Latex Rash    RASH TURNS INTO WELTS  . Other Itching and Rash    GREEN (BELL) PEPPERS   . Tape Rash    RASH TURNS INTO WELTS    FAMILY HISTORY: Family History  Problem Relation Age of Onset  . Heart attack Mother   . Heart attack Father   . Healthy Sister   . Psychiatric Illness  Brother        Multiple personality disorder  . Healthy Brother   . Healthy Brother   . Healthy Sister   . Healthy Sister   . Healthy Sister   . Diabetes Mellitus I Son   . Bone cancer Paternal Grandmother   . Lung cancer Paternal Grandfather     SOCIAL HISTORY: Social History   Socioeconomic History  . Marital status: Married    Spouse name: Not on file  . Number of children: 2  . Years of education: GED  . Highest education level: Not on file  Social Needs  . Financial resource strain: Not on file  . Food insecurity - worry: Not on file  . Food insecurity - inability: Not on file  . Transportation needs - medical: Not on file  . Transportation needs - non-medical: Not on file  Occupational History  . Occupation: Disabled    Comment: Secondary to seizures  Tobacco Use  . Smoking status: Current Every Day Smoker    Packs/day: 0.25    Years: 15.00    Pack years: 3.75    Types: Cigarettes  . Smokeless tobacco: Never Used  Substance and Sexual Activity  . Alcohol use: No  . Drug use: No  . Sexual activity: Yes    Partners: Male  Other Topics Concern  . Not on file  Social History Narrative   She is married mother of 2 with one stepson. Her first child was from a different father. She lives with her husband and son. They have recently moved from [place] to [facility].   She has been smoking for about 15 years.   She walks quite a bit several days a week at least 1-1/2-2 hours at a slow pace.    REVIEW OF SYSTEMS: Constitutional: No fevers, chills, or sweats, no generalized fatigue, change in appetite Eyes: No visual changes, double vision, eye pain Ear, nose and throat: No hearing loss, ear pain, nasal congestion, sore throat Cardiovascular: No chest pain, palpitations Respiratory:  No shortness of breath at rest or with exertion, wheezes GastrointestinaI: No nausea, vomiting, diarrhea, abdominal pain, fecal incontinence Genitourinary:  No dysuria,  urinary retention or frequency Musculoskeletal:  + neck pain, back pain Integumentary: No rash, pruritus, skin lesions Neurological: as above Psychiatric: + depression, insomnia, anxiety Endocrine: No palpitations, fatigue, diaphoresis, mood swings, change in appetite,  change in weight, increased thirst Hematologic/Lymphatic:  No anemia, purpura, petechiae. Allergic/Immunologic: no itchy/runny eyes, nasal congestion, recent allergic reactions, rashes  PHYSICAL EXAM: Vitals:   12/30/16 1031  BP: 130/72  Pulse: 80  SpO2: 96%   General: No acute distress Head:  Normocephalic/atraumatic Eyes: Fundoscopic exam shows bilateral sharp discs, no vessel changes, exudates, or hemorrhages Neck: supple, no paraspinal tenderness, full range of motion Back: No paraspinal tenderness Heart: regular rate and rhythm Lungs: Clear to auscultation bilaterally. Vascular: No carotid bruits. Skin/Extremities: No rash, no edema Neurological Exam: Mental status: alert and oriented to person, place, and time, no dysarthria or aphasia, Fund of knowledge is appropriate.  Recent and remote memory are intact.  Attention and concentration are normal.    Able to name objects and repeat phrases. Cranial nerves: CN I: not tested CN II: pupils equal, round and reactive to light, visual fields intact, fundi unremarkable. CN III, IV, VI:  full range of motion, no nystagmus, no ptosis CN V: facial sensation intact CN VII: upper and lower face symmetric CN VIII: hearing intact to finger rub CN IX, X: gag intact, uvula midline CN XI: sternocleidomastoid and trapezius muscles intact CN XII: tongue midline Bulk & Tone: normal, no fasciculations. Motor: 5/5 throughout with no pronator drift. Sensation: intact to light touch, cold, pin, vibration and joint position sense.  No extinction to double simultaneous stimulation.  Romberg test negative Deep Tendon Reflexes: +2 throughout, no ankle clonus Plantar responses:  downgoing bilaterally Cerebellar: no incoordination on finger to nose, heel to shin. No dysdiadochokinesia Gait: narrow-based and steady, able to tandem walk adequately. Tremor: none  IMPRESSION: This is a 41 year old right-handed woman with a history of bipolar disorder, seizures since 2009, presenting to establish care. 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 We discussed doing a 48-hour EEG to further classify her seizures. Continue Keppra 1000mg  BID for now. Records from her prior neurologist and from [facility] will be requested for review.  [place] driving laws were discussed with the patient, and she knows to stop driving after a seizure, until 6 months seizure-free. She does not drive. She will follow-up after the EEG and knows to call for any changes.   Thank you for allowing me to participate in the care of this patient. Please do not hesitate to call for any questions or concerns.   Patrcia Dolly, M.D.  CC: Dr. Venetia Bennett

## 2016-12-30 NOTE — Patient Instructions (Addendum)
1. Schedule  48-hour EEG 2. Continue Keppra 1000mg  twice a day 3. Records from your previous physicians will be requested for review 4. Follow-up after 48-hour EEG  Seizure Precautions: 1. If medication has been prescribed for you to prevent seizures, take it exactly as directed.  Do not stop taking the medicine without talking to your doctor first, even if you have not had a seizure in a long time.   2. Avoid activities in which a seizure would cause danger to yourself or to others.  Don't operate dangerous machinery, swim alone, or climb in high or dangerous places, such as on ladders, roofs, or girders.  Do not drive unless your doctor says you may.  3. If you have any warning that you may have a seizure, lay down in a safe place where you can't hurt yourself.    4.  No driving for 6 months from last seizure, as per [place] state law.   Please refer to the following link on the Epilepsy Foundation of America's website for more information: http://www.epilepsyfoundation.org/answerplace/Social/driving/drivingu.cfm   5.  Maintain good sleep hygiene. Avoid alcohol  6.  Notify your neurology if you are planning pregnancy or if you become pregnant.  7.  Contact your doctor if you have any problems that may be related to the medicine you are taking.  8.  Call 911 and bring the patient back to the ED if:        A.  The seizure lasts longer than 5 minutes.       B.  The patient doesn't awaken shortly after the seizure  C.  The patient has new problems such as difficulty seeing, speaking or moving  D.  The patient was injured during the seizure  E.  The patient has a temperature over 102 F (39C)  F.  The patient vomited and now is having trouble breathing

## 2017-01-01 ENCOUNTER — Ambulatory Visit: Payer: No Typology Code available for payment source | Attending: Family Medicine | Admitting: Family Medicine

## 2017-01-01 ENCOUNTER — Encounter: Payer: Self-pay | Admitting: Family Medicine

## 2017-01-01 VITALS — BP 145/84 | HR 63 | Temp 97.7°F | Ht 64.0 in | Wt 195.0 lb

## 2017-01-01 DIAGNOSIS — Z716 Tobacco abuse counseling: Secondary | ICD-10-CM | POA: Insufficient documentation

## 2017-01-01 DIAGNOSIS — Z79899 Other long term (current) drug therapy: Secondary | ICD-10-CM | POA: Insufficient documentation

## 2017-01-01 DIAGNOSIS — F319 Bipolar disorder, unspecified: Secondary | ICD-10-CM | POA: Insufficient documentation

## 2017-01-01 DIAGNOSIS — J4520 Mild intermittent asthma, uncomplicated: Secondary | ICD-10-CM

## 2017-01-01 DIAGNOSIS — R030 Elevated blood-pressure reading, without diagnosis of hypertension: Secondary | ICD-10-CM

## 2017-01-01 DIAGNOSIS — Z720 Tobacco use: Secondary | ICD-10-CM | POA: Insufficient documentation

## 2017-01-01 DIAGNOSIS — G4709 Other insomnia: Secondary | ICD-10-CM

## 2017-01-01 DIAGNOSIS — Z7982 Long term (current) use of aspirin: Secondary | ICD-10-CM | POA: Insufficient documentation

## 2017-01-01 DIAGNOSIS — R569 Unspecified convulsions: Secondary | ICD-10-CM | POA: Insufficient documentation

## 2017-01-01 DIAGNOSIS — I10 Essential (primary) hypertension: Secondary | ICD-10-CM | POA: Insufficient documentation

## 2017-01-01 DIAGNOSIS — G4700 Insomnia, unspecified: Secondary | ICD-10-CM | POA: Insufficient documentation

## 2017-01-01 DIAGNOSIS — M940 Chondrocostal junction syndrome [Tietze]: Secondary | ICD-10-CM

## 2017-01-01 MED ORDER — ALBUTEROL SULFATE HFA 108 (90 BASE) MCG/ACT IN AERS: 1.0000 | INHALATION_SPRAY | Freq: Four times a day (QID) | RESPIRATORY_TRACT | 3 refills | Status: AC | PRN

## 2017-01-01 MED ORDER — ALBUTEROL SULFATE (2.5 MG/3ML) 0.083% IN NEBU: 2.5000 mg | INHALATION_SOLUTION | Freq: Four times a day (QID) | RESPIRATORY_TRACT | 6 refills | Status: AC | PRN

## 2017-01-01 MED ORDER — TRAZODONE HCL 150 MG PO TABS: 150.0000 mg | ORAL_TABLET | Freq: Every evening | ORAL | 6 refills | Status: DC | PRN

## 2017-01-01 MED ORDER — MELOXICAM 7.5 MG PO TABS: 7.5000 mg | ORAL_TABLET | Freq: Every day | ORAL | 3 refills | Status: AC

## 2017-01-01 MED FILL — ALBUTEROL 0.083% INHAL SOLN: (2.5 MG/3ML | 7 days supply | Qty: 90 | Fill #0

## 2017-01-01 MED FILL — $VENTOLIN HFA 18G INHALER: 108 (90 BAS | 25 days supply | Qty: 18 | Fill #0

## 2017-01-01 MED FILL — traZODone HCL 150 MG TABS: 150 | 30 days supply | Qty: 30 | Fill #0

## 2017-01-01 NOTE — Progress Notes (Signed)
Subjective:  Patient ID: Denise Bennett, female    DOB: Feb 24, 1975  Age: 41 y.o. MRN: [id number]  CC: Asthma   HPI Michaila Delsa SaleCarper is a 41 year old female with a history of seizures, asthma, costochondritis. who presents today for a follow-up visit.   She was seen by cardiology on 11/02/16 due to ongoing chest pains and palpitations in the setting of a previous history of left bundle branch block on EKG. Chest pain was thought to be due to chest wall pain and the plan was for an event monitor which are on hold due to patient's financial constraints.  Two days ago she was seen by neurology for management of her seizures and the plan was for home monitor.  She has been compliant with her Keppra but her seizures have not been controlled.  Her asthma is controlled on her current regimen and she denies any acute exacerbations.  She complains of insomnia which she has had chronically but has worsened in the last 2 weeks.  She endorses using caffeinated products until 2 PM but none after that. Previously took trazodone 200 mg which helped with her insomnia.  Past Medical History:  Diagnosis Date  . Bipolar 1 disorder (HCC)    Manic - depressive.  . Concussion   . 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 Hypertension   . Seizures (HCC)    Reportedly "deep brain seizures " - has some right-sided weakness (decreased sensation of facial nerve) over the seizures with some intermittent Bell's palsy symptoms.    Past Surgical History:  Procedure Laterality Date  . CESAREAN SECTION  10/31/1992, 03/02/1996.  Marland Kitchen. KNEE SURGERY Left 05/08/2013    Outpatient Medications Prior to Visit  Medication Sig Dispense Refill  . acetaminophen (TYLENOL) 500 MG tablet Take 500-1,000 mg by mouth every 6 (six) hours as needed for headache.    Marland Kitchen. aspirin 81 MG tablet Take 81 mg by mouth daily.    . diphenhydrAMINE (BENADRYL) 25 mg capsule Take 25 mg by mouth every 6 (six) hours as needed for  itching or allergies.    Marland Kitchen. levETIRAcetam (KEPPRA) 1000 MG tablet Take 1 tablet (1,000 mg total) by mouth 2 (two) times daily. 60 tablet 3  . Multiple Vitamin (MULTIVITAMIN WITH MINERALS) TABS tablet Take 1 tablet daily by mouth.    Marland Kitchen. albuterol (PROVENTIL HFA;VENTOLIN HFA) 108 (90 Base) MCG/ACT inhaler Inhale 1-2 puffs into the lungs every 6 (six) hours as needed for wheezing or shortness of breath. 54 g 3  . meloxicam (MOBIC) 7.5 MG tablet Take 1 tablet (7.5 mg total) by mouth daily. 30 tablet 3   No facility-administered medications prior to visit.     ROS Review of Systems  Constitutional: Negative for activity change, appetite change and fatigue.  HENT: Negative for congestion, sinus pressure and sore throat.   Eyes: Negative for visual disturbance.  Respiratory: Negative for cough, chest tightness, shortness of breath and wheezing.   Cardiovascular: Negative for chest pain and palpitations.  Gastrointestinal: Negative for abdominal distention, abdominal pain and constipation.  Endocrine: Negative for polydipsia.  Genitourinary: Negative for dysuria and frequency.  Musculoskeletal: Negative for arthralgias and back pain.  Skin: Negative for rash.  Neurological: Negative for tremors, light-headedness and numbness.  Hematological: Does not bruise/bleed easily.  Psychiatric/Behavioral: Negative for agitation and behavioral problems.    Objective:  BP (!) 145/84   Pulse 63   Temp 97.7 F (36.5 C) (Oral)   Ht 5\' 4"  (1.626 m)  Wt 195 lb (88.5 kg)   LMP 12/26/2016   SpO2 98%   BMI 33.47 kg/m   BP/Weight 01/01/2017 12/30/2016 12/08/2016  Systolic BP 145 130 138  Diastolic BP 84 72 89  Wt. (Lbs) 195 195 -  BMI 33.47 32.95 -      Physical Exam  Constitutional: She is oriented to person, place, and time. She appears well-developed and well-nourished.  Cardiovascular: Normal rate, normal heart sounds and intact distal pulses.  No murmur heard. Pulmonary/Chest: Effort normal  and breath sounds normal. She has no wheezes. She has no rales. She exhibits tenderness (TTP of sternum).  Abdominal: Soft. Bowel sounds are normal. She exhibits no distension and no mass. There is no tenderness.  Musculoskeletal: Normal range of motion.  Neurological: She is alert and oriented to person, place, and time.  Skin: Skin is warm and dry.  Psychiatric: She has a normal mood and affect.     Assessment & Plan:   1. Costochondritis Uncontrolled - meloxicam (MOBIC) 7.5 MG tablet; Take 1 tablet (7.5 mg total) by mouth daily.  Dispense: 30 tablet; Refill: 3  2. Mild intermittent asthma without complication Stable No acute exacerbation - albuterol (PROVENTIL HFA;VENTOLIN HFA) 108 (90 Base) MCG/ACT inhaler; Inhale 1-2 puffs into the lungs every 6 (six) hours as needed for wheezing or shortness of breath.  Dispense: 54 g; Refill: 3  3. Tobacco abuse Spent 4 minutes counseling on cessation, hazardous effect of tobacco abuse and risk involved She is working on quitting  4. Seizures (HCC) Currently on Keppra Scheduled for home EEG Keep appointment with neurology  5.  Elevated blood pressure She attributes this to whitecoat hypertension We will commence on antihypertensive at next visit if still elevated  6.  Insomnia Discussed sleep hygiene Placed on trazodone  Meds ordered this encounter  Medications  . meloxicam (MOBIC) 7.5 MG tablet    Sig: Take 1 tablet (7.5 mg total) by mouth daily.    Dispense:  30 tablet    Refill:  3  . albuterol (PROVENTIL HFA;VENTOLIN HFA) 108 (90 Base) MCG/ACT inhaler    Sig: Inhale 1-2 puffs into the lungs every 6 (six) hours as needed for wheezing or shortness of breath.    Dispense:  54 g    Refill:  3  . traZODone (DESYREL) 150 MG tablet    Sig: Take 1 tablet (150 mg total) by mouth at bedtime as needed for sleep.    Dispense:  30 tablet    Refill:  6  . albuterol (PROVENTIL) (2.5 MG/3ML) 0.083% nebulizer solution    Sig: Take 3 mLs  (2.5 mg total) by nebulization every 6 (six) hours as needed for wheezing or shortness of breath.    Dispense:  75 mL    Refill:  6    Follow-up: Return in about 3 months (around 04/01/2017) for Follow-up of asthma.   Jaclyn ShaggyEnobong Amao MD

## 2017-01-01 NOTE — Patient Instructions (Signed)
Costochondritis Costochondritis is swelling and irritation (inflammation) of the tissue (cartilage) that connects your ribs to your breastbone (sternum). This causes pain in the front of your chest. Usually, the pain:  Starts gradually.  Is in more than one rib.  This condition usually goes away on its own over time. Follow these instructions at home:  Do not do anything that makes your pain worse.  If directed, put ice on the painful area: ? Put ice in a plastic bag. ? Place a towel between your skin and the bag. ? Leave the ice on for 20 minutes, 2-3 times a day.  If directed, put heat on the affected area as often as told by your doctor. Use the heat source that your doctor tells you to use, such as a moist heat pack or a heating pad. ? Place a towel between your skin and the heat source. ? Leave the heat on for 20-30 minutes. ? Take off the heat if your skin turns bright red. This is very important if you cannot feel pain, heat, or cold. You may have a greater risk of getting burned.  Take over-the-counter and prescription medicines only as told by your doctor.  Return to your normal activities as told by your doctor. Ask your doctor what activities are safe for you.  Keep all follow-up visits as told by your doctor. This is important. Contact a doctor if:  You have chills or a fever.  Your pain does not go away or it gets worse.  You have a cough that does not go away. Get help right away if:  You are short of breath. This information is not intended to replace advice given to you by your health care provider. Make sure you discuss any questions you have with your health care provider. Document Released: 07/01/2007 Document Revised: 08/02/2015 Document Reviewed: 05/08/2015 Elsevier Interactive Patient Education  2018 [employer]  

## 2017-01-12 MED FILL — MELOXICAM 7.5 MG TABLET: 7.5 | 30 days supply | Qty: 30 | Fill #3

## 2017-01-12 MED FILL — ?LEVETIRACETAM 1000 MG TAB: 1000 MG | 30 days supply | Qty: 60 | Fill #3

## 2017-02-08 ENCOUNTER — Encounter (HOSPITAL_COMMUNITY): Payer: Self-pay | Admitting: Urgent Care

## 2017-02-08 ENCOUNTER — Ambulatory Visit (HOSPITAL_COMMUNITY)
Admission: EM | Admit: 2017-02-08 | Discharge: 2017-02-08 | Disposition: A | Discharge status: Home / Self Care | Payer: No Typology Code available for payment source | Attending: Urgent Care | Admitting: Urgent Care

## 2017-02-08 DIAGNOSIS — M940 Chondrocostal junction syndrome [Tietze]: Secondary | ICD-10-CM

## 2017-02-08 DIAGNOSIS — F17200 Nicotine dependence, unspecified, uncomplicated: Secondary | ICD-10-CM

## 2017-02-08 DIAGNOSIS — R030 Elevated blood-pressure reading, without diagnosis of hypertension: Secondary | ICD-10-CM

## 2017-02-08 DIAGNOSIS — R5383 Other fatigue: Secondary | ICD-10-CM

## 2017-02-08 DIAGNOSIS — R0789 Other chest pain: Secondary | ICD-10-CM

## 2017-02-08 DIAGNOSIS — R42 Dizziness and giddiness: Secondary | ICD-10-CM

## 2017-02-08 DIAGNOSIS — R002 Palpitations: Secondary | ICD-10-CM

## 2017-02-08 DIAGNOSIS — R5381 Other malaise: Secondary | ICD-10-CM

## 2017-02-08 DIAGNOSIS — R569 Unspecified convulsions: Secondary | ICD-10-CM

## 2017-02-08 LAB — POCT I-STAT, CHEM 8
BUN: 15 mg/dL (ref 6–20)
CHLORIDE: 106 mmol/L (ref 101–111)
CREATININE: 0.7 mg/dL (ref 0.44–1.00)
Calcium, Ion: 1.19 mmol/L (ref 1.15–1.40)
Glucose, Bld: 71 mg/dL (ref 65–99)
HCT: 44 % (ref 36.0–46.0)
Hemoglobin: 15 g/dL (ref 12.0–15.0)
POTASSIUM: 4.3 mmol/L (ref 3.5–5.1)
Sodium: 141 mmol/L (ref 135–145)
TCO2: 23 mmol/L (ref 22–32)

## 2017-02-08 NOTE — ED Provider Notes (Signed)
MRN: [id number] DOB: [birth year]  Subjective:   Denise Bennett is a 42 y.o. female with pmh of LBBB presenting for 1 day history of palpitations, dizziness, pale face, malaise, weakness. Also having mild mid-sternal chest pain last ~10 seconds, aching type sensation with intermittent sharp stabbing pains. Has associated nausea without vomiting. Denies fever, abdominal pain. Also has a chronic cough that is unchanged. Smokes <1/2ppd, is trying to quit. Patient has costochondritis associated with seizures. Patient is being managed for this by [facility].   [name] is allergic to cinnamon; penicillins; latex; other; and tape.  Denise Bennett  has a past medical history of Bipolar 1 disorder (HCC), Concussion, Costochondritis, Hypertension, and Seizures (HCC). Also  has a past surgical history that includes Cesarean section (10/31/1992, 03/02/1996.) and Knee surgery (Left, 05/08/2013). Her family history includes Bone cancer in her paternal grandmother; Diabetes Mellitus I in her son; Healthy in her brother, brother, sister, sister, sister, and sister; Heart attack in her father and mother; Lung cancer in her paternal grandfather; Psychiatric Illness in her brother; Seizures in her maternal uncle.   Objective:   Vitals: BP (!) 154/90   Pulse 60   Temp (!) 97.2 F (36.2 C) (Oral)   Resp 16   Wt 183 lb (83 kg)   LMP 01/19/2017   SpO2 97%   BMI 31.41 kg/m   Physical Exam  Constitutional: She is oriented to person, place, and time. She appears well-developed and well-nourished.  HENT:  Mouth/Throat: Oropharynx is clear and moist.  Eyes: No scleral icterus.  Cardiovascular: Normal rate, regular rhythm and intact distal pulses. Exam reveals no gallop and no friction rub.  No murmur heard. Pulmonary/Chest: No respiratory distress. She has no wheezes. She has no rales. She exhibits tenderness (mid-right sided superficial).  Abdominal: Soft. Bowel sounds are normal. She exhibits no distension and no mass.  There is no tenderness.  Musculoskeletal: She exhibits no edema.  Neurological: She is alert and oriented to person, place, and time.  Skin: Skin is warm and dry. Capillary refill takes less than 2 seconds. No rash noted. No erythema. No pallor.  Psychiatric: She has a normal mood and affect.   ED ECG REPORT   Date: 02/08/2017  Rate: 60bpm  Rhythm: normal sinus rhythm  QRS Axis: normal  Intervals: normal  ST/T Wave abnormalities: normal  Conduction Disutrbances:left bundle branch block  Narrative Interpretation: LBBB, in sinus rhythm at 60bpm, j point elevation in Lead V3, no acute changes  Old EKG Reviewed: changes noted  I have personally reviewed the EKG tracing and agree with the computerized printout as noted.   Results for orders placed or performed during the hospital encounter of 02/08/17 (from the past 24 hour(s))  I-STAT, chem 8     Status: None   Collection Time: 02/08/17 10:53 AM  Result Value Ref Range   Sodium 141 135 - 145 mmol/L   Potassium 4.3 3.5 - 5.1 mmol/L   Chloride 106 101 - 111 mmol/L   BUN 15 6 - 20 mg/dL   Creatinine, Ser 8.460.70 0.44 - 1.00 mg/dL   Glucose, Bld 71 65 - 99 mg/dL   Calcium, Ion 9.621.19 9.521.15 - 1.40 mmol/L   TCO2 23 22 - 32 mmol/L   Hemoglobin 15.0 12.0 - 15.0 g/dL   HCT 84.144.0 32.436.0 - 40.146.0 %    Assessment and Plan :   Palpitations  Chest wall pain  Costochondritis  Malaise and fatigue  Dizziness  Tobacco use disorder  Seizures (HCC)  Elevated  blood pressure reading without diagnosis of hypertension  I do not suspect her chest pain in cardiac in nature. ECG, physical exam findings reassuring. Stat chemistry is also negative for anemia, electrolyte disturbances. Patient will contact her cardiologist, Dr. Herbie Baltimore, who has been working with her on palpitations. Return-to-clinic precautions discussed, patient verbalized understanding.    Wallis Bamberg, PA-C 02/08/17 1114

## 2017-02-08 NOTE — ED Triage Notes (Signed)
PT reports history of a LBBB. PT reports palpitations yesterday that made her feel dizzy and "lose the color in her face." PT last saw cardiology more than a month ago. PT reports she is supposed to wear a monitor for 2 weeks, but cannot afford it.

## 2017-02-08 NOTE — Discharge Instructions (Addendum)
You may take 500mg Tylenol every 6 hours for pain and inflammation. ° °

## 2017-02-19 ENCOUNTER — Other Ambulatory Visit: Payer: Self-pay | Admitting: Family Medicine

## 2017-02-19 DIAGNOSIS — R569 Unspecified convulsions: Secondary | ICD-10-CM

## 2017-02-23 MED FILL — levETIRAcetam 1000 MG TABS: 1000 | 30 days supply | Qty: 60 | Fill #0

## 2017-02-23 MED FILL — traZODone HCL 150 MG TABS: 150 | 30 days supply | Qty: 30 | Fill #1

## 2017-03-01 ENCOUNTER — Ambulatory Visit (INDEPENDENT_AMBULATORY_CARE_PROVIDER_SITE_OTHER): Payer: Self-pay | Admitting: Neurology

## 2017-03-01 DIAGNOSIS — G40209 Localization-related (focal) (partial) symptomatic epilepsy and epileptic syndromes with complex partial seizures, not intractable, without status epilepticus: Secondary | ICD-10-CM

## 2017-03-17 ENCOUNTER — Telehealth: Payer: Self-pay

## 2017-03-17 NOTE — Procedures (Signed)
ELECTROENCEPHALOGRAM REPORT  Dates of Recording: 03/01/2017 10:11AM to 03/03/2017 10:11AM  Patient's Name: Denise Bennett MRN: [id number] Date of Birth: 1975/02/21  Referring Provider: Dr. Patrcia DollyKaren Adryan Shin  Procedure: 48-hour ambulatory EEG  History: This is a 42 year old woman with recurrent seizures. EEG for classification.  Medications:  KEPPRA 1000 MG tablet TYLENOL 500 MG tablet   PROVENTIL HFA;VENTOLIN HFA 108 (90 Base) MCG/ACT inhaler aspirin 81 MG tablet BENADRYL 25 mg capsule  MOBIC 7.5 MG tablet MULTIVITAMIN WITH MINERALS TABS   Technical Summary: This is a 48-hour multichannel digital EEG recording measured by the international 10-20 system with electrodes applied with paste and impedances below 5000 ohms performed as portable with EKG monitoring.  The digital EEG was referentially recorded, reformatted, and digitally filtered in a variety of bipolar and referential montages for optimal display.    DESCRIPTION OF RECORDING: During maximal wakefulness, the background activity consisted of a poorly sustained symmetric 10 Hz posterior dominant rhythm which was poorly reactive to eye opening and eye closure.  There were no epileptiform discharges or focal slowing seen in wakefulness.  During the recording, the patient progresses through wakefulness, drowsiness, and Stage 2 sleep.  Again, there were no epileptiform discharges seen.  Events: On 02/04 at 1017 hours, she has chest pain. There were technical difficulties at this time, no EEG recording at specified time  On 02/04 at 2319 hours, she reports right side of face drooping. Right side of face not visible on video. Electrographically, there were no EEG or EKG changes seen.  On 02/05 at 1000 hours, she had chest pain and tingle in right arm. Electrographically, there were no EEG or EKG changes seen.  On 02/05 at 1945 hours, she has right side facial drooping, not clearly visible on video. Electrographically, there were no  EEG or EKG changes seen.  On 02/06 at 0431 hours, she was awoken by chest pain. Electrographically, there were no EEG or EKG changes seen.  On 02/06 at 0631 hours, she has slight droop on right side of face with dull ache on right back of head behind ear. Right side of face not visible on video. Electrographically, there were no EEG or EKG changes seen.  There were no electrographic seizures seen.  EKG lead was unremarkable.  IMPRESSION: This 48-hour ambulatory EEG study is normal.  Episodes of right facial drooping and chest pain did not show electrographic correlate.  CLINICAL CORRELATION: A normal EEG does not exclude a clinical diagnosis of epilepsy. Episodes of right facial drooping did not show electrographic correlate and were not clearly visible on video. Simple partial seizures can have negative scalp correlate.  If further clinical questions remain, inpatient video EEG monitoring may be helpful.   Patrcia DollyKaren Liston Thum, M.D.

## 2017-03-17 NOTE — Telephone Encounter (Signed)
-----   Message from Van ClinesKaren M Aquino, MD sent at 03/17/2017 12:46 PM EST ----- Pls let her know the EEG was normal. Recommend staying on same dose of Keppra 1000mg  BID, no medication changes at this time. thanks

## 2017-03-17 NOTE — Telephone Encounter (Signed)
Spoke with pt relaying message below.   

## 2017-03-29 MED FILL — traZODone HCL 150 MG TABS: 150 | 30 days supply | Qty: 30 | Fill #2

## 2017-03-29 MED FILL — ?LEVETIRACETAM 1000 MG TAB: 1000 MG | 30 days supply | Qty: 60 | Fill #1

## 2017-04-01 ENCOUNTER — Encounter: Payer: Self-pay | Admitting: Family Medicine

## 2017-04-01 ENCOUNTER — Ambulatory Visit: Payer: No Typology Code available for payment source | Attending: Family Medicine | Admitting: Family Medicine

## 2017-04-01 VITALS — BP 137/87 | HR 70 | Temp 98.1°F | Ht 64.0 in | Wt 199.8 lb

## 2017-04-01 DIAGNOSIS — J45909 Unspecified asthma, uncomplicated: Secondary | ICD-10-CM | POA: Insufficient documentation

## 2017-04-01 DIAGNOSIS — Z7982 Long term (current) use of aspirin: Secondary | ICD-10-CM | POA: Insufficient documentation

## 2017-04-01 DIAGNOSIS — J018 Other acute sinusitis: Secondary | ICD-10-CM

## 2017-04-01 DIAGNOSIS — J019 Acute sinusitis, unspecified: Secondary | ICD-10-CM | POA: Insufficient documentation

## 2017-04-01 DIAGNOSIS — N951 Menopausal and female climacteric states: Secondary | ICD-10-CM | POA: Insufficient documentation

## 2017-04-01 DIAGNOSIS — R569 Unspecified convulsions: Secondary | ICD-10-CM | POA: Insufficient documentation

## 2017-04-01 DIAGNOSIS — F319 Bipolar disorder, unspecified: Secondary | ICD-10-CM | POA: Insufficient documentation

## 2017-04-01 DIAGNOSIS — G253 Myoclonus: Secondary | ICD-10-CM | POA: Insufficient documentation

## 2017-04-01 DIAGNOSIS — R232 Flushing: Secondary | ICD-10-CM

## 2017-04-01 DIAGNOSIS — J4520 Mild intermittent asthma, uncomplicated: Secondary | ICD-10-CM | POA: Insufficient documentation

## 2017-04-01 DIAGNOSIS — I10 Essential (primary) hypertension: Secondary | ICD-10-CM | POA: Insufficient documentation

## 2017-04-01 DIAGNOSIS — Z79899 Other long term (current) drug therapy: Secondary | ICD-10-CM | POA: Insufficient documentation

## 2017-04-01 MED ORDER — CLONIDINE HCL 0.1 MG PO TABS: 0.1000 mg | ORAL_TABLET | Freq: Every day | ORAL | 3 refills | Status: AC

## 2017-04-01 MED FILL — MELOXICAM 7.5 MG TABLET: 7.5 | 30 days supply | Qty: 30 | Fill #0

## 2017-04-01 NOTE — Patient Instructions (Signed)

## 2017-04-01 NOTE — Progress Notes (Signed)
Subjective:  Patient ID: Denise Bennett, female    DOB: 1975/02/24  Age: 42 y.o. MRN: [id number]  CC: Asthma; Sore Throat; and Hot Flashes   HPI Denise Bennett presents for  is a 42 year old female with a history of seizures, asthma, costochondritis. who presents today for a follow-up visit.   She complains of a few days history of rhinorrhea, cough, postnasal drip and frontal sinus pressure but no headache or fever.  She just started taking Benadryl last night and is increasing her fluids intake. Denies myalgias or arthralgias. Also complains of hot flashes which she has had sporadically but was severe last night with her being drenched from head to toe and had to take of her clothes.  Her menstrual periods have been normal and she does not think she is close to menopause.  Seizures have been controlled with none lately and her recent EEG from 03/01/17 was normal.  She is closely followed by neurology. Costochondritis is stable and she takes an NSAID for this.  Past Medical History:  Diagnosis Date  . Bipolar 1 disorder (HCC)    Manic - depressive.  . Concussion   . 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 Hypertension   . Seizures (HCC)    Reportedly "deep brain seizures " - has some right-sided weakness (decreased sensation of facial nerve) over the seizures with some intermittent Bell's palsy symptoms.    Past Surgical History:  Procedure Laterality Date  . CESAREAN SECTION  10/31/1992, 03/02/1996.  Marland Kitchen. KNEE SURGERY Left 05/08/2013    Allergies  Allergen Reactions  . Cinnamon Anaphylaxis  . Penicillins Anaphylaxis and Other (See Comments)    Has patient had a PCN reaction causing immediate rash, facial/tongue/throat swelling, SOB or lightheadedness with hypotension: Yes Has patient had a PCN reaction causing severe rash involving mucus membranes or skin necrosis: Unknown Has patient had a PCN reaction that required hospitalization: Yes Has patient had  a PCN reaction occurring within the last 10 years: No If all of the above answers are "NO", then may proceed with Cephalosporin use. "Ended up in an O2 tent"   . Latex Rash    RASH TURNS INTO WELTS  . Other Itching and Rash    GREEN (BELL) PEPPERS   . Tape Rash    RASH TURNS INTO WELTS     Outpatient Medications Prior to Visit  Medication Sig Dispense Refill  . acetaminophen (TYLENOL) 500 MG tablet Take 500-1,000 mg by mouth every 6 (six) hours as needed for headache.    . albuterol (PROVENTIL HFA;VENTOLIN HFA) 108 (90 Base) MCG/ACT inhaler Inhale 1-2 puffs into the lungs every 6 (six) hours as needed for wheezing or shortness of breath. 54 g 3  . albuterol (PROVENTIL) (2.5 MG/3ML) 0.083% nebulizer solution Take 3 mLs (2.5 mg total) by nebulization every 6 (six) hours as needed for wheezing or shortness of breath. 75 mL 6  . aspirin 81 MG tablet Take 81 mg by mouth daily.    . diphenhydrAMINE (BENADRYL) 25 mg capsule Take 25 mg by mouth every 6 (six) hours as needed for itching or allergies.    Marland Kitchen. levETIRAcetam (KEPPRA) 1000 MG tablet Take 1 tablet (1,000 mg total) by mouth 2 (two) times daily. 60 tablet 3  . meloxicam (MOBIC) 7.5 MG tablet Take 1 tablet (7.5 mg total) by mouth daily. 30 tablet 3  . Multiple Vitamin (MULTIVITAMIN WITH MINERALS) TABS tablet Take 1 tablet daily by mouth.    .Marland Kitchen  traZODone (DESYREL) 150 MG tablet Take 1 tablet (150 mg total) by mouth at bedtime as needed for sleep. 30 tablet 6   No facility-administered medications prior to visit.     ROS Review of Systems  Constitutional: Negative for activity change, appetite change and fatigue.  HENT: Positive for rhinorrhea and sore throat. Negative for congestion and sinus pressure.   Eyes: Negative for visual disturbance.  Respiratory: Positive for cough. Negative for chest tightness, shortness of breath and wheezing.   Cardiovascular: Negative for chest pain and palpitations.  Gastrointestinal: Negative for  abdominal distention, abdominal pain and constipation.  Endocrine: Negative for polydipsia.  Genitourinary: Negative for dysuria and frequency.  Musculoskeletal: Negative for arthralgias and back pain.  Skin: Negative for rash.  Neurological: Negative for tremors, light-headedness and numbness.  Hematological: Does not bruise/bleed easily.  Psychiatric/Behavioral: Negative for agitation and behavioral problems.    Objective:  BP 137/87   Pulse 70   Temp 98.1 F (36.7 C) (Oral)   Ht 5\' 4"  (1.626 m)   Wt 199 lb 12.8 oz (90.6 kg)   LMP 02/26/2017   SpO2 93%   BMI 34.30 kg/m   BP/Weight 04/01/2017 02/08/2017 01/01/2017  Systolic BP 137 154 145  Diastolic BP 87 90 84  Wt. (Lbs) 199.8 183 195  BMI 34.3 31.41 33.47      Physical Exam  Constitutional: She is oriented to person, place, and time. She appears well-developed and well-nourished.  Cardiovascular: Normal rate, normal heart sounds and intact distal pulses.  No murmur heard. Pulmonary/Chest: Effort normal and breath sounds normal. She has no wheezes. She has no rales. She exhibits no tenderness.  Abdominal: Soft. Bowel sounds are normal. She exhibits no distension and no mass. There is no tenderness.  Musculoskeletal: Normal range of motion.  Neurological: She is alert and oriented to person, place, and time.  Skin: Skin is warm and dry.  Psychiatric: She has a normal mood and affect.     Assessment & Plan:   1. Seizures (HCC) Controlled on Keppra Recently had normal EEG Followed by neurology  2. Hot flashes - cloNIDine (CATAPRES) 0.1 MG tablet; Take 1 tablet (0.1 mg total) by mouth at bedtime.  Dispense: 30 tablet; Refill: 3  3. Mild intermittent asthma without complication Controlled Continue rescue inhaler as needed  4. Acute non-recurrent sinusitis of other sinus No antibiotic indicated at this time Advised to increase fluid intake, nasal rinses and flushes Continue with OTC antihistamines and analgesic as  needed   Meds ordered this encounter  Medications  . cloNIDine (CATAPRES) 0.1 MG tablet    Sig: Take 1 tablet (0.1 mg total) by mouth at bedtime.    Dispense:  30 tablet    Refill:  3    Follow-up: Return in about 2 months (around 06/01/2017) for complete physical exam.   Hoy Register MD

## 2017-04-13 ENCOUNTER — Telehealth: Payer: Self-pay | Admitting: Cardiology

## 2017-04-13 NOTE — Telephone Encounter (Signed)
Mrs. Denise Bennett returned her application for [employer] Heart Monitor.  [facility] and will be faxed to [employer].   The company will review the application and contact the patient their decision.

## 2017-04-15 ENCOUNTER — Encounter: Payer: Self-pay | Admitting: *Deleted

## 2017-04-15 NOTE — Progress Notes (Signed)
Patient ID: Denise Bennett, female   DOB: 24-Jan-1976, 42 y.o.   MRN: [id number]  Patient enrolled for [employer] to ship a 30 day cardiac event monitor to her home, pending approval of her [employer] hardship program application.

## 2017-04-30 MED FILL — cloNIDine HCL 0.1 MG TABS: 0.1 | 30 days supply | Qty: 30 | Fill #0

## 2017-04-30 MED FILL — levETIRAcetam 1000 MG TABS: 1000 | 30 days supply | Qty: 60 | Fill #2

## 2017-04-30 MED FILL — traZODone HCL 150 MG TABS: 150 | 30 days supply | Qty: 30 | Fill #3

## 2017-04-30 MED FILL — MELOXICAM 7.5 MG TABLET: 7.5 | 30 days supply | Qty: 30 | Fill #1

## 2017-05-27 MED FILL — traZODone HCL 150 MG TABS: 150 | 30 days supply | Qty: 30 | Fill #4

## 2017-05-27 MED FILL — levETIRAcetam 1000 MG TABS: 1000 | 30 days supply | Qty: 60 | Fill #3

## 2017-05-27 MED FILL — MELOXICAM 7.5 MG TABLET: 7.5 | 30 days supply | Qty: 30 | Fill #2

## 2017-05-27 MED FILL — cloNIDine HCL 0.1 MG TABS: 0.1 | 30 days supply | Qty: 30 | Fill #1

## 2017-06-14 ENCOUNTER — Ambulatory Visit: Payer: Self-pay | Attending: Family Medicine | Admitting: Family Medicine

## 2017-06-14 ENCOUNTER — Encounter: Payer: Self-pay | Admitting: Family Medicine

## 2017-06-14 VITALS — BP 125/82 | HR 70 | Temp 97.9°F | Ht 64.0 in | Wt 197.6 lb

## 2017-06-14 DIAGNOSIS — Z7982 Long term (current) use of aspirin: Secondary | ICD-10-CM | POA: Insufficient documentation

## 2017-06-14 DIAGNOSIS — F319 Bipolar disorder, unspecified: Secondary | ICD-10-CM | POA: Insufficient documentation

## 2017-06-14 DIAGNOSIS — I10 Essential (primary) hypertension: Secondary | ICD-10-CM | POA: Insufficient documentation

## 2017-06-14 DIAGNOSIS — Z1231 Encounter for screening mammogram for malignant neoplasm of breast: Secondary | ICD-10-CM

## 2017-06-14 DIAGNOSIS — Z124 Encounter for screening for malignant neoplasm of cervix: Secondary | ICD-10-CM

## 2017-06-14 DIAGNOSIS — Z1239 Encounter for other screening for malignant neoplasm of breast: Secondary | ICD-10-CM

## 2017-06-14 DIAGNOSIS — Z880 Allergy status to penicillin: Secondary | ICD-10-CM | POA: Insufficient documentation

## 2017-06-14 DIAGNOSIS — Z0000 Encounter for general adult medical examination without abnormal findings: Secondary | ICD-10-CM

## 2017-06-14 DIAGNOSIS — Z13228 Encounter for screening for other metabolic disorders: Secondary | ICD-10-CM

## 2017-06-14 DIAGNOSIS — Z114 Encounter for screening for human immunodeficiency virus [HIV]: Secondary | ICD-10-CM

## 2017-06-14 DIAGNOSIS — Z79899 Other long term (current) drug therapy: Secondary | ICD-10-CM | POA: Insufficient documentation

## 2017-06-14 NOTE — Progress Notes (Signed)
Subjective:  Patient ID: Denise Bennett, female    DOB: 05-21-1975  Age: 42 y.o. MRN: [id number]  CC: Gynecologic Exam and Annual Exam   HPI Denise Bennett presents for a complete physical exam. She is sad due to the fact that she has been unable to  afford to pay for an event monitor recommended by cardiology to evaluate her palpitations and this explains her elevated PHQ 9 score today.  Has not had a seizure in the last 5 months.  She had a mammogram 3 years ago which she was told was normal and Pap smear was several years ago.  Past Medical History:  Diagnosis Date  . Bipolar 1 disorder (HCC)    Manic - depressive.  . Concussion   . 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 Hypertension   . Seizures ([place])    Reportedly "deep brain seizures " - has some right-sided weakness (decreased sensation of facial nerve) over the seizures with some intermittent Bell's palsy symptoms.    Past Surgical History:  Procedure Laterality Date  . CESAREAN SECTION  10/31/1992, 03/02/1996.  Marland Kitchen KNEE SURGERY Left 05/08/2013    Allergies  Allergen Reactions  . Cinnamon Anaphylaxis  . Penicillins Anaphylaxis and Other (See Comments)    Has patient had a PCN reaction causing immediate rash, facial/tongue/throat swelling, SOB or lightheadedness with hypotension: Yes Has patient had a PCN reaction causing severe rash involving mucus membranes or skin necrosis: Unknown Has patient had a PCN reaction that required hospitalization: Yes Has patient had a PCN reaction occurring within the last 10 years: No If all of the above answers are "NO", then may proceed with Cephalosporin use. "Ended up in an O2 tent"   . Latex Rash    RASH TURNS INTO WELTS  . Other Itching and Rash    GREEN (BELL) PEPPERS   . Tape Rash    RASH TURNS INTO WELTS      Outpatient Medications Prior to Visit  Medication Sig Dispense Refill  . acetaminophen (TYLENOL) 500 MG tablet Take 500-1,000 mg by  mouth every 6 (six) hours as needed for headache.    . albuterol (PROVENTIL HFA;VENTOLIN HFA) 108 (90 Base) MCG/ACT inhaler Inhale 1-2 puffs into the lungs every 6 (six) hours as needed for wheezing or shortness of breath. 54 g 3  . albuterol (PROVENTIL) (2.5 MG/3ML) 0.083% nebulizer solution Take 3 mLs (2.5 mg total) by nebulization every 6 (six) hours as needed for wheezing or shortness of breath. 75 mL 6  . aspirin 81 MG tablet Take 81 mg by mouth daily.    . cloNIDine (CATAPRES) 0.1 MG tablet Take 1 tablet (0.1 mg total) by mouth at bedtime. 30 tablet 3  . diphenhydrAMINE (BENADRYL) 25 mg capsule Take 25 mg by mouth every 6 (six) hours as needed for itching or allergies.    Marland Kitchen levETIRAcetam (KEPPRA) 1000 MG tablet Take 1 tablet (1,000 mg total) by mouth 2 (two) times daily. 60 tablet 3  . meloxicam (MOBIC) 7.5 MG tablet Take 1 tablet (7.5 mg total) by mouth daily. 30 tablet 3  . Multiple Vitamin (MULTIVITAMIN WITH MINERALS) TABS tablet Take 1 tablet daily by mouth.    . traZODone (DESYREL) 150 MG tablet Take 1 tablet (150 mg total) by mouth at bedtime as needed for sleep. 30 tablet 6   No facility-administered medications prior to visit.     ROS Review of Systems  Constitutional: Negative for activity change, appetite change and  fatigue.  HENT: Negative for congestion, sinus pressure and sore throat.   Eyes: Negative for visual disturbance.  Respiratory: Negative for cough, chest tightness, shortness of breath and wheezing.   Cardiovascular: Negative for chest pain and palpitations.  Gastrointestinal: Negative for abdominal distention, abdominal pain and constipation.  Endocrine: Negative for polydipsia.  Genitourinary: Negative for dysuria and frequency.  Musculoskeletal: Negative for arthralgias and back pain.  Skin: Negative for rash.  Neurological: Negative for tremors, light-headedness and numbness.  Hematological: Does not bruise/bleed easily.  Psychiatric/Behavioral: Negative  for agitation and behavioral problems.    Objective:  BP 125/82   Pulse 70   Temp 97.9 F (36.6 C) (Oral)   Ht _0  (1.626 m)   Wt 197 lb 9.6 oz (89.6 kg)   SpO2 93%   BMI 33.92 kg/m   BP/Weight 06/14/2017 04/01/2017 6/62/9476  Systolic BP 546 503 546  Diastolic BP 82 87 90  Wt. (Lbs) 197.6 199.8 183  BMI 33.92 34.3 31.41      Physical Exam  Constitutional: She is oriented to person, place, and time. She appears well-developed and well-nourished. No distress.  HENT:  Head: Normocephalic.  Right Ear: External ear normal.  Left Ear: External ear normal.  Nose: Nose normal.  Mouth/Throat: Oropharynx is clear and moist.  Eyes: Pupils are equal, round, and reactive to light. Conjunctivae and EOM are normal.  Neck: Normal range of motion. No JVD present.  Cardiovascular: Normal rate, regular rhythm, normal heart sounds and intact distal pulses. Exam reveals no gallop.  No murmur heard. Pulmonary/Chest: Effort normal and breath sounds normal. No respiratory distress. She has no wheezes. She has no rales. She exhibits no tenderness.  Abdominal: Soft. Bowel sounds are normal. She exhibits no distension and no mass. There is no tenderness.  Musculoskeletal: Normal range of motion. She exhibits no edema or tenderness.  Neurological: She is alert and oriented to person, place, and time. She has normal reflexes.  Skin: Skin is warm and dry. She is not diaphoretic.  Psychiatric: She has a normal mood and affect.     Assessment & Plan:   1. Annual physical exam Counseled on 150 minutes of exercise per week, healthy eating (including decreased daily intake of saturated fats, cholesterol, added sugars, sodium), STI prevention, routine healthcare maintenance. Counseled on dangers of ongoing tobacco abuse  2. Screening for cervical cancer - Cytology - PAP([place])  3. Screening for breast cancer - MM Digital Screening; Future  4. Screening for HIV (human immunodeficiency  virus) - HIV antibody  5. Screening for metabolic disorder - FKC12+XNTZ - Lipid panel   No orders of the defined types were placed in this encounter.   Follow-up: Return in about 3 months (around 09/14/2017) for follow up of chronic medical conditions.   Charlott Rakes MD

## 2017-06-14 NOTE — Patient Instructions (Signed)

## 2017-06-15 ENCOUNTER — Other Ambulatory Visit: Payer: Self-pay

## 2017-06-15 ENCOUNTER — Telehealth: Payer: Self-pay

## 2017-06-15 LAB — CMP14+EGFR
A/G RATIO: 1.5 (ref 1.2–2.2)
ALK PHOS: 84 IU/L (ref 39–117)
ALT: 15 IU/L (ref 0–32)
AST: 11 IU/L (ref 0–40)
Albumin: 4 g/dL (ref 3.5–5.5)
BUN/Creatinine Ratio: 18 (ref 9–23)
BUN: 15 mg/dL (ref 6–24)
CALCIUM: 9.2 mg/dL (ref 8.7–10.2)
CHLORIDE: 103 mmol/L (ref 96–106)
CO2: 20 mmol/L (ref 20–29)
Creatinine, Ser: 0.82 mg/dL (ref 0.57–1.00)
GFR calc Af Amer: 101 mL/min/{1.73_m2} (ref >59)
GFR calc non Af Amer: 88 mL/min/{1.73_m2} (ref >59)
Globulin, Total: 2.7 g/dL (ref 1.5–4.5)
Glucose: 82 mg/dL (ref 65–99)
POTASSIUM: 4.1 mmol/L (ref 3.5–5.2)
SODIUM: 138 mmol/L (ref 134–144)
Total Protein: 6.7 g/dL (ref 6.0–8.5)

## 2017-06-15 LAB — HIV ANTIBODY (ROUTINE TESTING W REFLEX): HIV Screen 4th Generation wRfx: NONREACTIVE

## 2017-06-15 LAB — LIPID PANEL
CHOLESTEROL TOTAL: 201 mg/dL — AB (ref 100–199)
Chol/HDL Ratio: 5.3 ratio — ABNORMAL HIGH (ref 0.0–4.4)
HDL: 38 mg/dL — ABNORMAL LOW (ref >39)
LDL CALC: 143 mg/dL — AB (ref 0–99)
TRIGLYCERIDES: 99 mg/dL (ref 0–149)
VLDL Cholesterol Cal: 20 mg/dL (ref 5–40)

## 2017-06-15 MED ORDER — TETANUS-DIPHTH-ACELL PERTUSSIS 5-2.5-18.5 LF-MCG/0.5 IM SUSP: 0.5000 mL | Freq: Once | INTRAMUSCULAR | 0 refills | Status: AC

## 2017-06-15 NOTE — Telephone Encounter (Signed)
-----   Message from Hoy Register, MD sent at 06/15/2017 12:33 PM EDT ----- Liver and Kidney functions are normal.Her cholesterol is elevated. We will hold off on placing her on a statin at this time as her cardiovascular risk is low, please encourage to comply with a low-cholesterol diet and exercise

## 2017-06-15 NOTE — Telephone Encounter (Signed)
Patient was called and informed of lab results. 

## 2017-06-16 LAB — CYTOLOGY - PAP
Bacterial vaginitis: NEGATIVE
Candida vaginitis: NEGATIVE
Chlamydia: NEGATIVE
Diagnosis: NEGATIVE
HPV (WINDOPATH): NOT DETECTED
Neisseria Gonorrhea: NEGATIVE
TRICH (WINDOWPATH): NEGATIVE

## 2017-06-17 ENCOUNTER — Telehealth: Payer: Self-pay

## 2017-06-17 NOTE — Telephone Encounter (Signed)
Patient was called and informed of Pap results.

## 2017-06-17 NOTE — Telephone Encounter (Signed)
-----   Message from Hoy Register, MD sent at 06/16/2017  1:19 PM EDT ----- Pap smear is negative for malignancy, negative for STDs, negative for bacterial vaginosis and yeast infection

## 2017-06-22 LAB — CERVICOVAGINAL ANCILLARY ONLY: Herpes: NEGATIVE

## 2017-06-23 MED FILL — $BOOSTRIX VACCINE SYRINGE: 5-2.5-18.5 | 1 days supply | Qty: 1 | Fill #0

## 2017-06-28 ENCOUNTER — Other Ambulatory Visit: Payer: Self-pay | Admitting: Neurology

## 2017-06-28 DIAGNOSIS — R569 Unspecified convulsions: Secondary | ICD-10-CM

## 2017-06-28 MED FILL — levETIRAcetam 1000 MG TABS: 1000 | 30 days supply | Qty: 60 | Fill #0

## 2017-06-28 MED FILL — cloNIDine HCL 0.1 MG TABS: 0.1 | 30 days supply | Qty: 30 | Fill #2

## 2017-06-28 MED FILL — traZODone HCL 150 MG TABS: 150 | 30 days supply | Qty: 30 | Fill #5

## 2017-07-23 MED FILL — cloNIDine HCL 0.1 MG TABS: 0.1 | 30 days supply | Qty: 30 | Fill #3

## 2017-07-23 MED FILL — MELOXICAM 7.5 MG TABLET: 7.5 | 30 days supply | Qty: 30 | Fill #3

## 2017-07-23 MED FILL — traZODone HCL 150 MG TABS: 150 | 30 days supply | Qty: 30 | Fill #6

## 2017-07-23 MED FILL — levETIRAcetam 1000 MG TABS: 1000 | 30 days supply | Qty: 60 | Fill #1

## 2017-08-11 ENCOUNTER — Telehealth: Payer: Self-pay | Admitting: Cardiology

## 2017-08-11 NOTE — Telephone Encounter (Signed)
Taking order out of system for holter monitor due to :   08/02/17 [facility] if [facility] ever received the monitor ?njm  response: Wells, Clare CharonShelly A Miller, Norma J    I Received a message from [facility] 04/22/2017 stating patient was given a price for self pay but declined saying it was too much. I do not understand why they didn't qualify for the hardship program. I will try to call Tobi Bastosnna at [facility] tomorrow about  it.- Message

## 2017-08-13 NOTE — Telephone Encounter (Signed)
Thanks  DH 

## 2017-08-26 ENCOUNTER — Other Ambulatory Visit: Payer: Self-pay | Admitting: Family Medicine

## 2017-08-26 DIAGNOSIS — G4709 Other insomnia: Secondary | ICD-10-CM

## 2017-09-14 ENCOUNTER — Ambulatory Visit: Payer: Self-pay | Admitting: Family Medicine

## 2018-06-28 IMAGING — CR DG HIP (WITH OR WITHOUT PELVIS) 2-3V*R*
3 series · 3 of 3 positions shown · non-contrast
Comparison: None.

CLINICAL DATA: 41-year-old female with right hip pain.

EXAM:
DG HIP (WITH OR WITHOUT PELVIS) 2-3V RIGHT

[t hip ap right (1 of 2)]
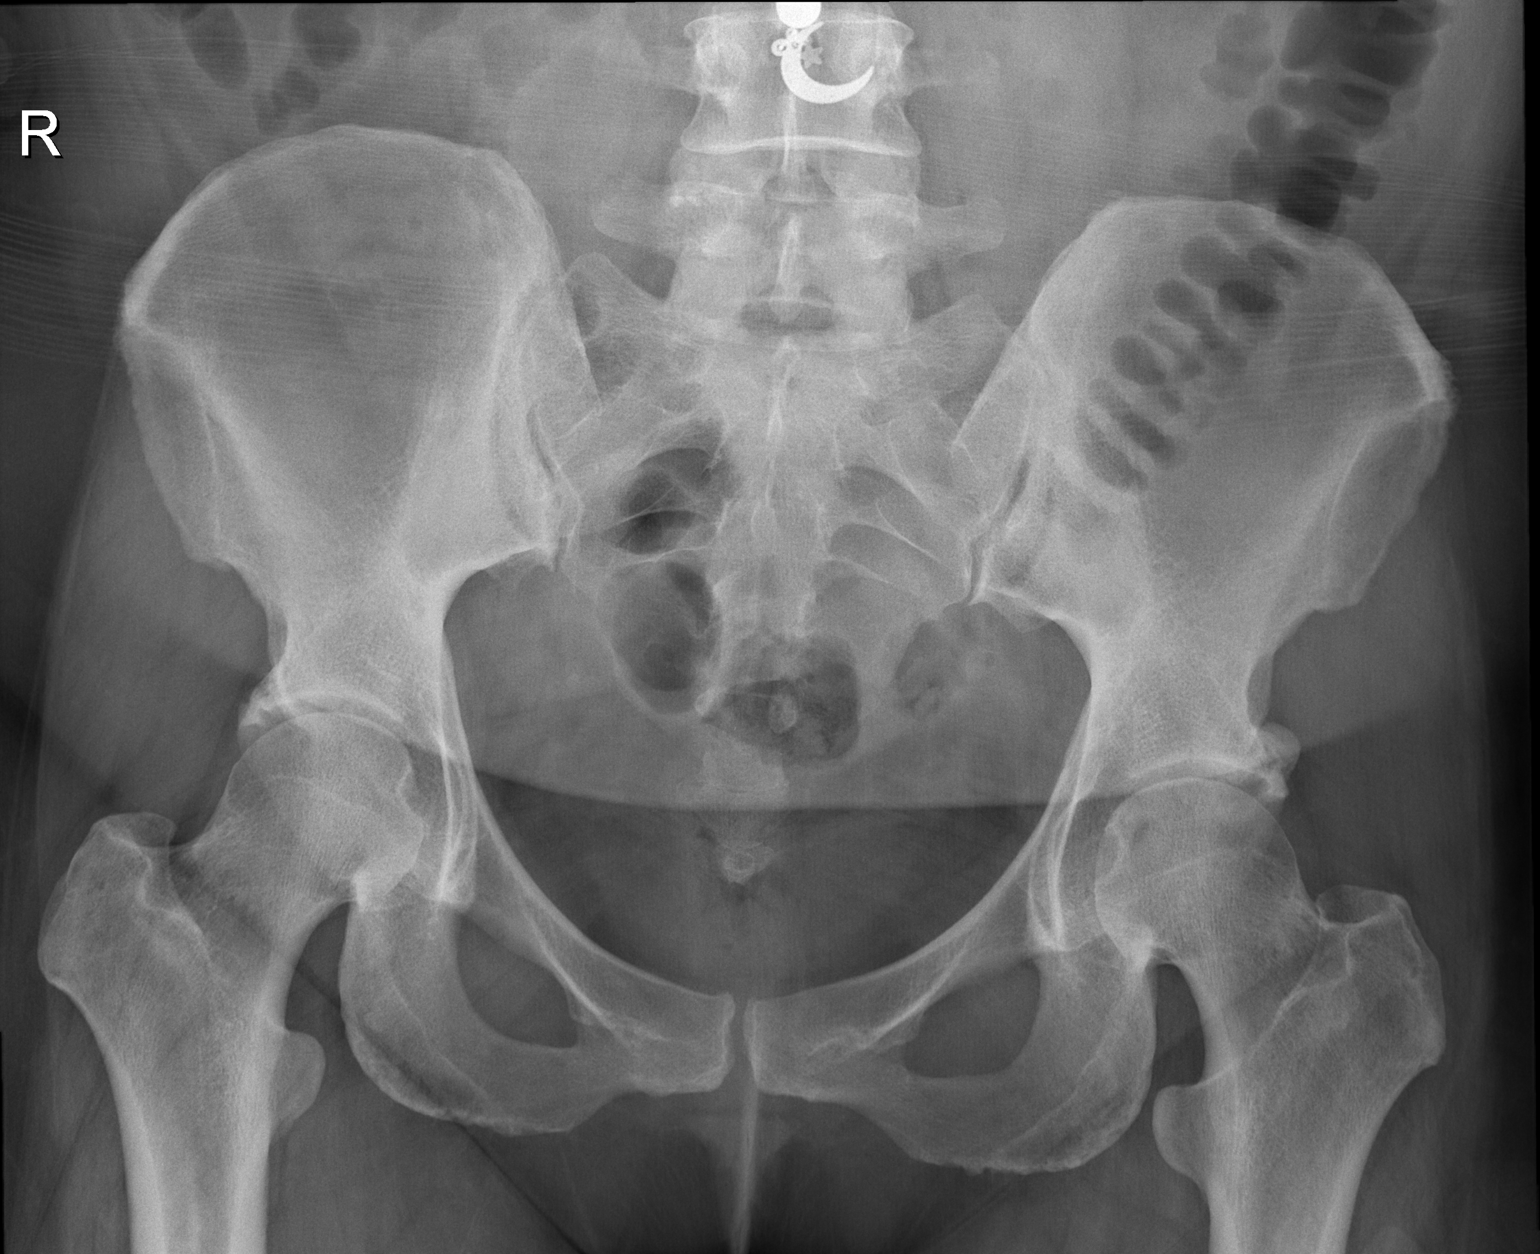

[t hip ap right (2 of 2)]
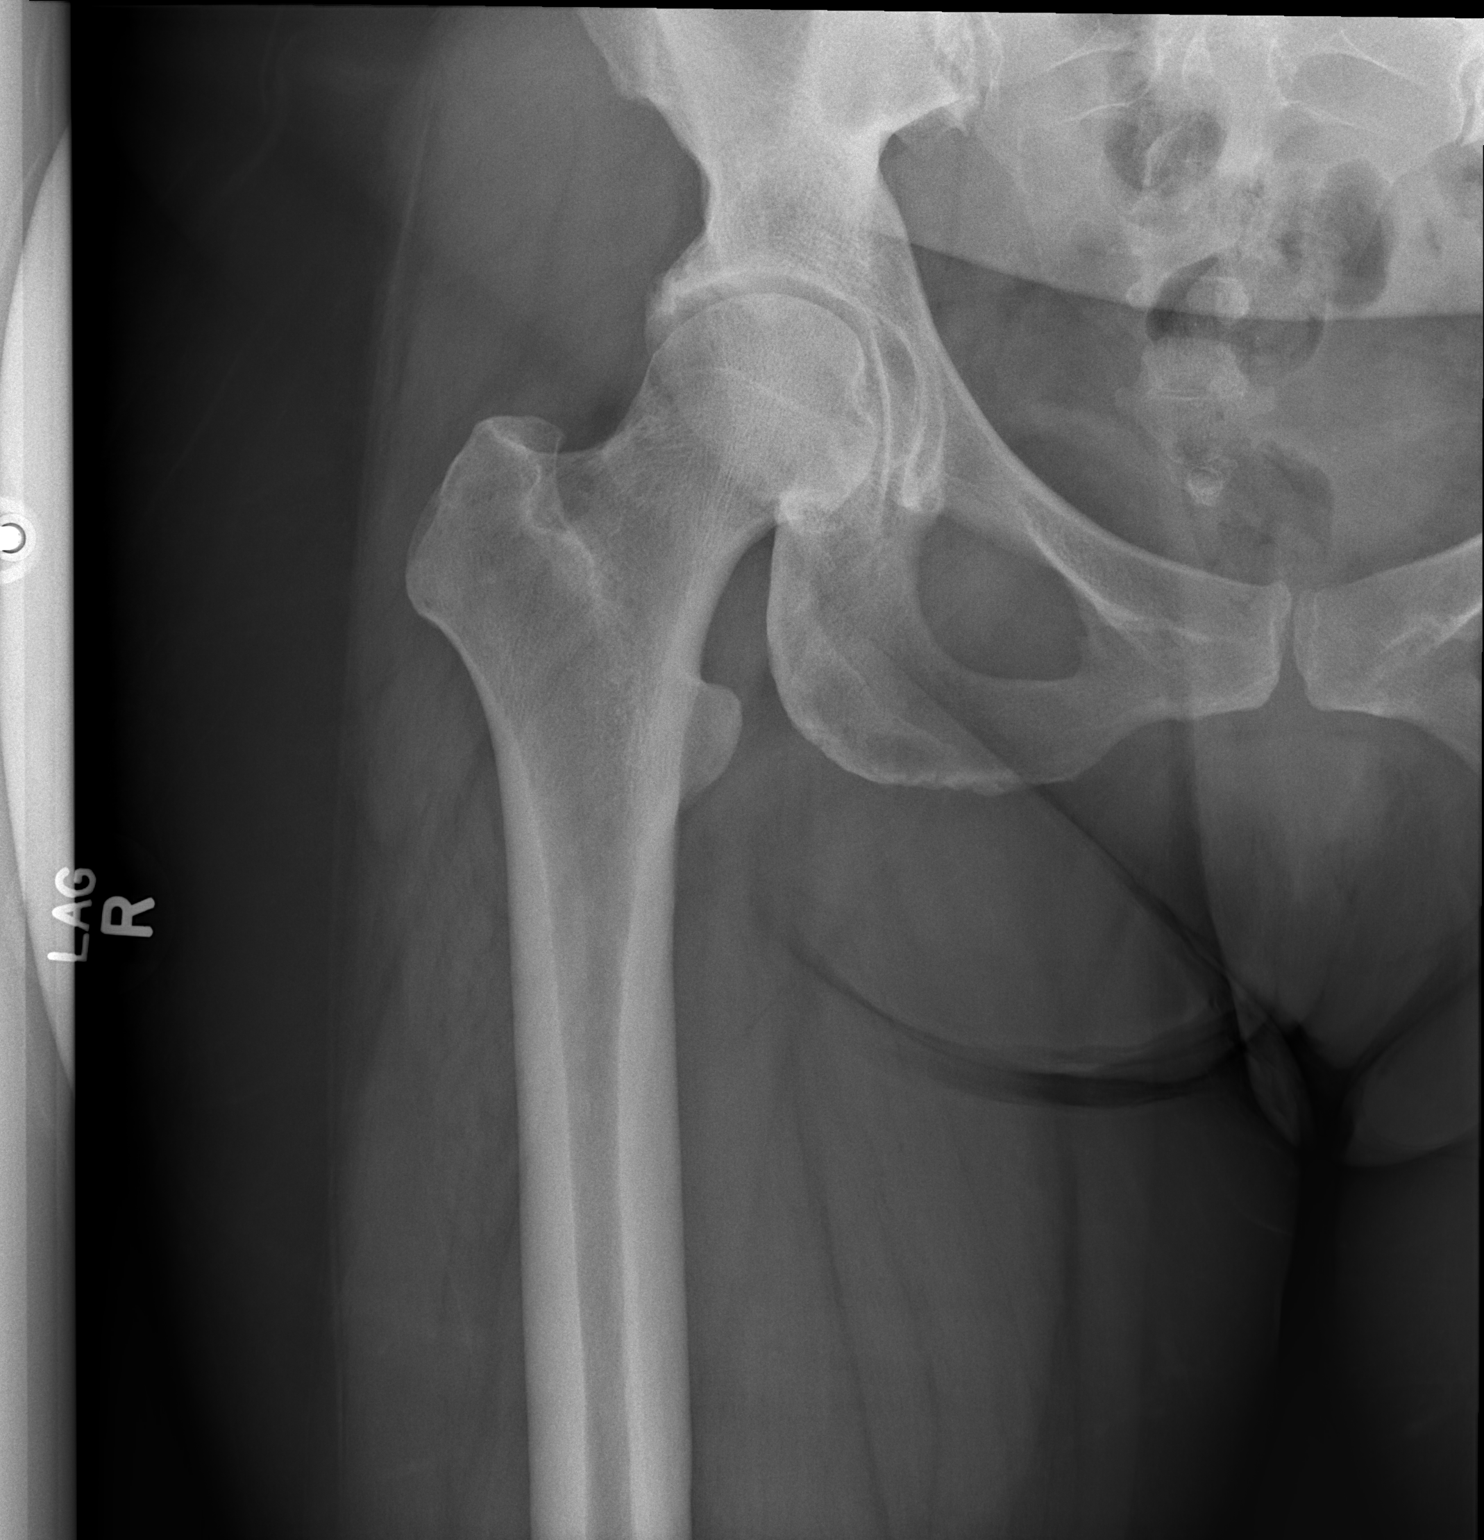

[t hip frog leg right]
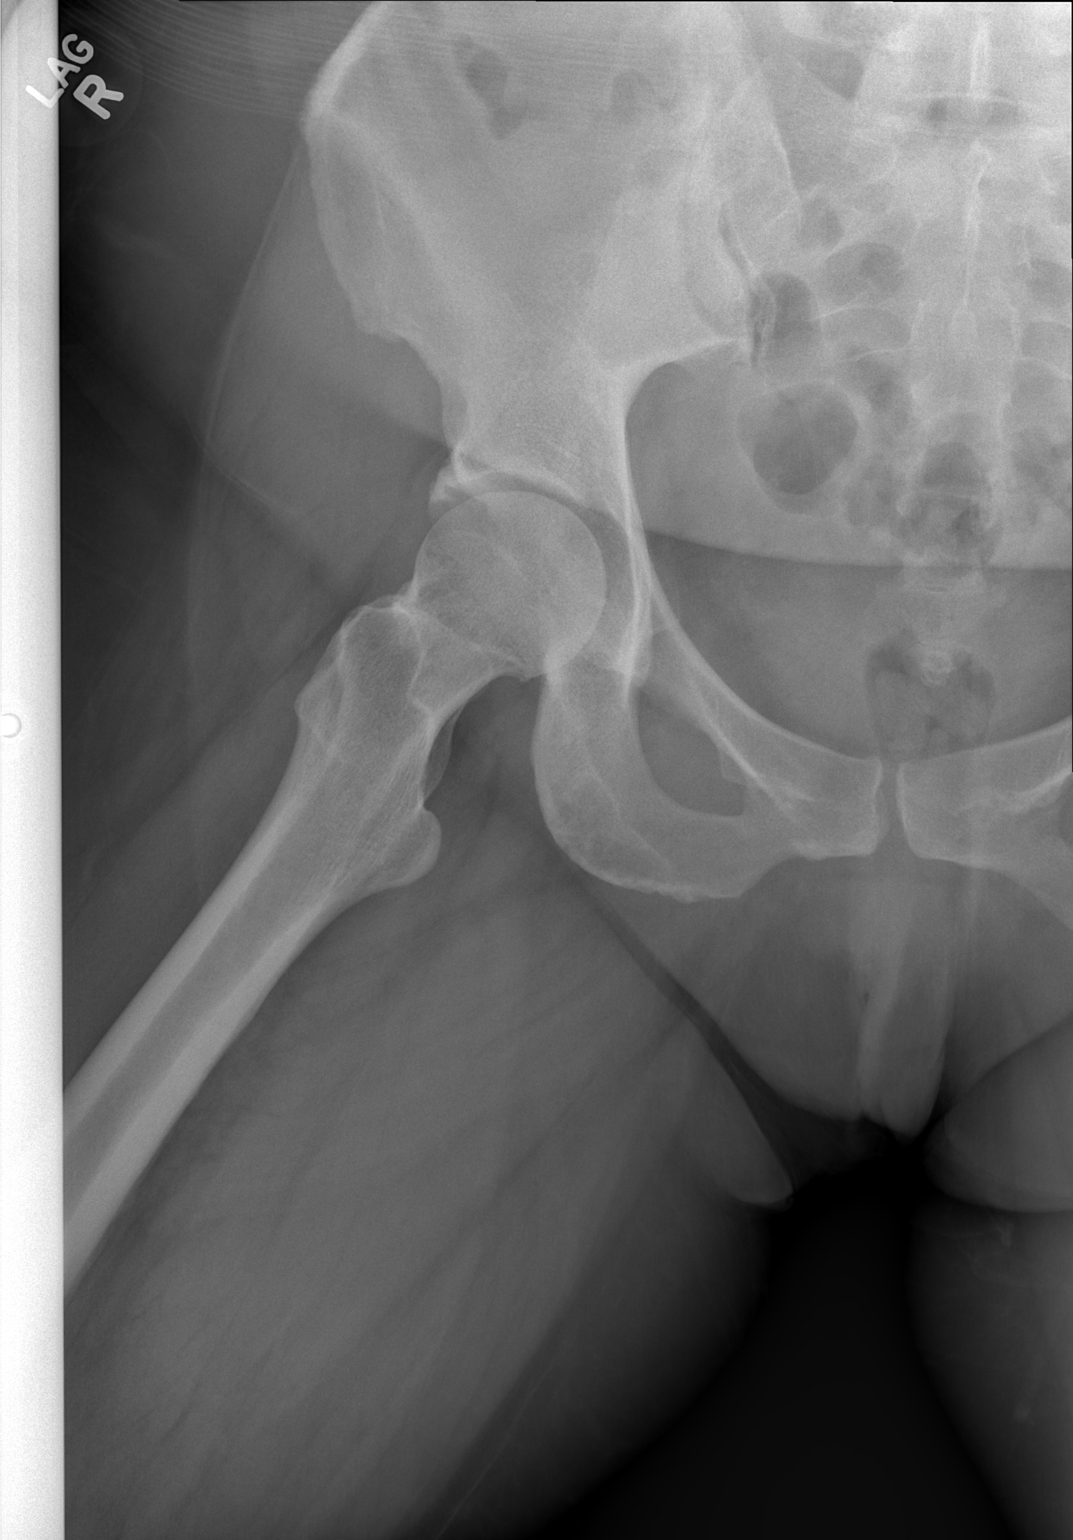

[3 of 3 positions shown; findings below may reference images not displayed]

FINDINGS: There is no acute fracture or dislocation. There is apparent slight
over coverage of the femoral head by the acetabula bilaterally
concerning for femoroacetabular impingement. Clinical correlation is
recommended. No significant arthritic changes. The soft tissues
appear unremarkable.
IMPRESSION: 1. No acute fracture or dislocation.
2. Findings concerning for femoroacetabular impingement bilaterally.
Clinical correlation is recommended.
